# Patient Record
Sex: Female | Born: 1935 | Race: White | Hispanic: No
Health system: Southern US, Community
[De-identification: ages and names within clinical notes are randomized; demographics above are authoritative.]

## PROBLEM LIST (undated history)

## (undated) DIAGNOSIS — Z8679 Personal history of other diseases of the circulatory system: Secondary | ICD-10-CM

## (undated) DIAGNOSIS — M797 Fibromyalgia: Secondary | ICD-10-CM

## (undated) DIAGNOSIS — K589 Irritable bowel syndrome without diarrhea: Secondary | ICD-10-CM

## (undated) DIAGNOSIS — F419 Anxiety disorder, unspecified: Secondary | ICD-10-CM

## (undated) DIAGNOSIS — I10 Essential (primary) hypertension: Secondary | ICD-10-CM

## (undated) DIAGNOSIS — K859 Acute pancreatitis without necrosis or infection, unspecified: Secondary | ICD-10-CM

## (undated) DIAGNOSIS — E785 Hyperlipidemia, unspecified: Secondary | ICD-10-CM

## (undated) DIAGNOSIS — K635 Polyp of colon: Secondary | ICD-10-CM

## (undated) DIAGNOSIS — F0390 Unspecified dementia without behavioral disturbance: Secondary | ICD-10-CM

## (undated) DIAGNOSIS — D649 Anemia, unspecified: Secondary | ICD-10-CM

## (undated) DIAGNOSIS — E079 Disorder of thyroid, unspecified: Secondary | ICD-10-CM

## (undated) DIAGNOSIS — G459 Transient cerebral ischemic attack, unspecified: Secondary | ICD-10-CM

## (undated) HISTORY — DX: Acute pancreatitis without necrosis or infection, unspecified: K85.90

## (undated) HISTORY — DX: Disorder of thyroid, unspecified: E07.9

## (undated) HISTORY — DX: Transient cerebral ischemic attack, unspecified: G45.9

## (undated) HISTORY — DX: Anxiety disorder, unspecified: F41.9

## (undated) HISTORY — PX: POLYPECTOMY: SHX149

## (undated) HISTORY — DX: Personal history of other diseases of the circulatory system: Z86.79

## (undated) HISTORY — DX: Hyperlipidemia, unspecified: E78.5

## (undated) HISTORY — DX: Polyp of colon: K63.5

## (undated) HISTORY — DX: Anemia, unspecified: D64.9

## (undated) HISTORY — PX: CHOLECYSTECTOMY: SHX55

## (undated) HISTORY — DX: Unspecified dementia, unspecified severity, without behavioral disturbance, psychotic disturbance, mood disturbance, and anxiety: F03.90

## (undated) HISTORY — PX: BLADDER SURGERY: SHX569

## (undated) HISTORY — DX: Fibromyalgia: M79.7

## (undated) HISTORY — DX: Irritable bowel syndrome, unspecified: K58.9

---

## 1998-11-29 ENCOUNTER — Other Ambulatory Visit: Admission: RE | Admit: 1998-11-29 | Discharge: 1998-11-29 | Payer: Self-pay | Admitting: Gynecology

## 1999-10-28 ENCOUNTER — Encounter: Payer: Self-pay | Admitting: Family Medicine

## 1999-10-28 ENCOUNTER — Encounter: Admission: RE | Admit: 1999-10-28 | Discharge: 1999-10-28 | Payer: Self-pay | Admitting: Family Medicine

## 2000-11-03 ENCOUNTER — Encounter: Payer: Self-pay | Admitting: Gynecology

## 2000-11-03 ENCOUNTER — Encounter: Admission: RE | Admit: 2000-11-03 | Discharge: 2000-11-03 | Payer: Self-pay | Admitting: Gynecology

## 2000-12-10 ENCOUNTER — Other Ambulatory Visit: Admission: RE | Admit: 2000-12-10 | Discharge: 2000-12-10 | Payer: Self-pay | Admitting: Gynecology

## 2001-02-08 ENCOUNTER — Ambulatory Visit (HOSPITAL_COMMUNITY): Admission: RE | Admit: 2001-02-08 | Discharge: 2001-02-08 | Payer: Self-pay

## 2001-02-12 ENCOUNTER — Ambulatory Visit (HOSPITAL_COMMUNITY): Admission: RE | Admit: 2001-02-12 | Discharge: 2001-02-12 | Payer: Self-pay

## 2001-03-08 ENCOUNTER — Encounter (INDEPENDENT_AMBULATORY_CARE_PROVIDER_SITE_OTHER): Payer: Self-pay

## 2001-03-08 ENCOUNTER — Ambulatory Visit (HOSPITAL_COMMUNITY): Admission: RE | Admit: 2001-03-08 | Discharge: 2001-03-08 | Payer: Self-pay | Admitting: Urology

## 2001-03-08 ENCOUNTER — Encounter: Payer: Self-pay | Admitting: Urology

## 2001-06-14 ENCOUNTER — Encounter: Payer: Self-pay | Admitting: Urology

## 2001-06-14 ENCOUNTER — Ambulatory Visit (HOSPITAL_COMMUNITY): Admission: RE | Admit: 2001-06-14 | Discharge: 2001-06-14 | Payer: Self-pay | Admitting: Urology

## 2001-06-24 ENCOUNTER — Emergency Department (HOSPITAL_COMMUNITY): Admission: EM | Admit: 2001-06-24 | Discharge: 2001-06-25 | Payer: Self-pay

## 2001-11-05 ENCOUNTER — Encounter: Payer: Self-pay | Admitting: Internal Medicine

## 2001-11-05 ENCOUNTER — Encounter: Admission: RE | Admit: 2001-11-05 | Discharge: 2001-11-05 | Payer: Self-pay | Admitting: Internal Medicine

## 2001-11-17 ENCOUNTER — Encounter: Payer: Self-pay | Admitting: Ophthalmology

## 2001-11-17 ENCOUNTER — Ambulatory Visit (HOSPITAL_COMMUNITY): Admission: RE | Admit: 2001-11-17 | Discharge: 2001-11-17 | Payer: Self-pay | Admitting: Ophthalmology

## 2001-12-30 ENCOUNTER — Encounter (HOSPITAL_BASED_OUTPATIENT_CLINIC_OR_DEPARTMENT_OTHER): Admission: RE | Admit: 2001-12-30 | Discharge: 2002-03-30 | Payer: Self-pay | Admitting: Internal Medicine

## 2002-05-05 ENCOUNTER — Emergency Department (HOSPITAL_COMMUNITY): Admission: EM | Admit: 2002-05-05 | Discharge: 2002-05-05 | Payer: Self-pay | Admitting: Emergency Medicine

## 2002-05-06 ENCOUNTER — Encounter: Payer: Self-pay | Admitting: Emergency Medicine

## 2002-05-12 ENCOUNTER — Ambulatory Visit (HOSPITAL_COMMUNITY): Admission: RE | Admit: 2002-05-12 | Discharge: 2002-05-12 | Payer: Self-pay | Admitting: Internal Medicine

## 2002-05-12 ENCOUNTER — Encounter: Payer: Self-pay | Admitting: Internal Medicine

## 2002-05-17 ENCOUNTER — Encounter: Payer: Self-pay | Admitting: Internal Medicine

## 2002-05-17 ENCOUNTER — Ambulatory Visit (HOSPITAL_COMMUNITY): Admission: RE | Admit: 2002-05-17 | Discharge: 2002-05-17 | Payer: Self-pay | Admitting: Internal Medicine

## 2002-07-06 ENCOUNTER — Ambulatory Visit (HOSPITAL_COMMUNITY): Admission: RE | Admit: 2002-07-06 | Discharge: 2002-07-06 | Payer: Self-pay | Admitting: Gastroenterology

## 2002-07-06 ENCOUNTER — Encounter (INDEPENDENT_AMBULATORY_CARE_PROVIDER_SITE_OTHER): Payer: Self-pay | Admitting: Specialist

## 2003-07-02 ENCOUNTER — Emergency Department (HOSPITAL_COMMUNITY): Admission: EM | Admit: 2003-07-02 | Discharge: 2003-07-02 | Payer: Self-pay

## 2003-07-15 ENCOUNTER — Ambulatory Visit (HOSPITAL_COMMUNITY): Admission: RE | Admit: 2003-07-15 | Discharge: 2003-07-15 | Payer: Self-pay | Admitting: Internal Medicine

## 2003-10-17 ENCOUNTER — Encounter: Admission: RE | Admit: 2003-10-17 | Discharge: 2003-10-17 | Payer: Self-pay | Admitting: Internal Medicine

## 2004-01-03 ENCOUNTER — Encounter: Admission: RE | Admit: 2004-01-03 | Discharge: 2004-01-03 | Payer: Self-pay | Admitting: Internal Medicine

## 2004-01-11 ENCOUNTER — Emergency Department (HOSPITAL_COMMUNITY): Admission: EM | Admit: 2004-01-11 | Discharge: 2004-01-11 | Payer: Self-pay | Admitting: Emergency Medicine

## 2004-01-17 ENCOUNTER — Encounter: Admission: RE | Admit: 2004-01-17 | Discharge: 2004-01-17 | Payer: Self-pay | Admitting: Internal Medicine

## 2004-01-19 ENCOUNTER — Emergency Department (HOSPITAL_COMMUNITY): Admission: EM | Admit: 2004-01-19 | Discharge: 2004-01-20 | Payer: Self-pay | Admitting: Emergency Medicine

## 2004-01-22 ENCOUNTER — Emergency Department (HOSPITAL_COMMUNITY): Admission: EM | Admit: 2004-01-22 | Discharge: 2004-01-23 | Payer: Self-pay | Admitting: Emergency Medicine

## 2004-01-24 ENCOUNTER — Inpatient Hospital Stay (HOSPITAL_COMMUNITY): Admission: AD | Admit: 2004-01-24 | Discharge: 2004-01-26 | Payer: Self-pay | Admitting: Internal Medicine

## 2004-06-10 ENCOUNTER — Ambulatory Visit (HOSPITAL_COMMUNITY): Admission: RE | Admit: 2004-06-10 | Discharge: 2004-06-10 | Payer: Self-pay | Admitting: Internal Medicine

## 2004-07-23 ENCOUNTER — Encounter: Admission: RE | Admit: 2004-07-23 | Discharge: 2004-07-23 | Payer: Self-pay | Admitting: Internal Medicine

## 2004-11-06 ENCOUNTER — Ambulatory Visit (HOSPITAL_COMMUNITY): Admission: RE | Admit: 2004-11-06 | Discharge: 2004-11-06 | Payer: Self-pay

## 2005-01-08 ENCOUNTER — Ambulatory Visit (HOSPITAL_COMMUNITY): Admission: RE | Admit: 2005-01-08 | Discharge: 2005-01-08 | Payer: Self-pay

## 2005-03-19 ENCOUNTER — Encounter: Admission: RE | Admit: 2005-03-19 | Discharge: 2005-03-19 | Payer: Self-pay | Admitting: Internal Medicine

## 2005-06-29 ENCOUNTER — Inpatient Hospital Stay (HOSPITAL_COMMUNITY): Admission: EM | Admit: 2005-06-29 | Discharge: 2005-07-06 | Payer: Self-pay | Admitting: Emergency Medicine

## 2005-07-25 ENCOUNTER — Inpatient Hospital Stay (HOSPITAL_COMMUNITY): Admission: EM | Admit: 2005-07-25 | Discharge: 2005-07-28 | Payer: Self-pay | Admitting: Emergency Medicine

## 2005-08-02 ENCOUNTER — Encounter: Admission: RE | Admit: 2005-08-02 | Discharge: 2005-08-02 | Payer: Self-pay | Admitting: Internal Medicine

## 2005-09-17 ENCOUNTER — Encounter: Payer: Self-pay | Admitting: Cardiology

## 2005-10-07 ENCOUNTER — Ambulatory Visit (HOSPITAL_COMMUNITY): Admission: RE | Admit: 2005-10-07 | Discharge: 2005-10-07 | Payer: Self-pay | Admitting: Gastroenterology

## 2005-10-07 ENCOUNTER — Encounter (INDEPENDENT_AMBULATORY_CARE_PROVIDER_SITE_OTHER): Payer: Self-pay | Admitting: Specialist

## 2005-10-19 ENCOUNTER — Observation Stay (HOSPITAL_COMMUNITY): Admission: EM | Admit: 2005-10-19 | Discharge: 2005-10-21 | Payer: Self-pay | Admitting: Emergency Medicine

## 2005-10-20 ENCOUNTER — Encounter: Payer: Self-pay | Admitting: Cardiology

## 2005-10-29 ENCOUNTER — Ambulatory Visit (HOSPITAL_COMMUNITY): Admission: RE | Admit: 2005-10-29 | Discharge: 2005-10-29 | Payer: Self-pay | Admitting: Gastroenterology

## 2006-01-15 ENCOUNTER — Ambulatory Visit: Payer: Self-pay | Admitting: Oncology

## 2006-01-27 LAB — CBC WITH DIFFERENTIAL/PLATELET
Eosinophils Absolute: 0.2 10*3/uL (ref 0.0–0.5)
MONO#: 0.7 10*3/uL (ref 0.1–0.9)
NEUT#: 12.1 10*3/uL — ABNORMAL HIGH (ref 1.5–6.5)
RBC: 3.75 10*6/uL (ref 3.70–5.32)
RDW: 16.1 % — ABNORMAL HIGH (ref 11.3–14.5)
WBC: 14.6 10*3/uL — ABNORMAL HIGH (ref 3.9–10.0)
lymph#: 1.6 10*3/uL (ref 0.9–3.3)

## 2006-01-27 LAB — CHCC SMEAR

## 2006-01-29 LAB — COMPREHENSIVE METABOLIC PANEL
AST: 19 U/L (ref 0–37)
Alkaline Phosphatase: 59 U/L (ref 39–117)
Glucose, Bld: 190 mg/dL — ABNORMAL HIGH (ref 70–99)
Sodium: 136 mEq/L (ref 135–145)
Total Bilirubin: 0.5 mg/dL (ref 0.3–1.2)
Total Protein: 8.1 g/dL (ref 6.0–8.3)

## 2006-01-29 LAB — IRON AND TIBC
%SAT: 12 % — ABNORMAL LOW (ref 20–55)
Iron: 40 ug/dL — ABNORMAL LOW (ref 42–145)

## 2006-01-29 LAB — IMMUNOFIXATION ELECTROPHORESIS
IgA: 388 mg/dL — ABNORMAL HIGH (ref 68–378)
Total Protein, Serum Electrophoresis: 8.1 g/dL (ref 6.0–8.3)

## 2006-01-29 LAB — FERRITIN: Ferritin: 25 ng/mL (ref 10–291)

## 2006-01-29 LAB — ERYTHROPOIETIN: Erythropoietin: 20.3 m[IU]/mL (ref 2.6–34.0)

## 2006-03-02 ENCOUNTER — Ambulatory Visit: Payer: Self-pay | Admitting: Oncology

## 2006-03-05 LAB — CBC WITH DIFFERENTIAL/PLATELET
Basophils Absolute: 0 10*3/uL (ref 0.0–0.1)
Eosinophils Absolute: 0.2 10*3/uL (ref 0.0–0.5)
HGB: 11 g/dL — ABNORMAL LOW (ref 11.6–15.9)
MCV: 85.9 fL (ref 81.0–101.0)
MONO#: 0.6 10*3/uL (ref 0.1–0.9)
MONO%: 5.2 % (ref 0.0–13.0)
NEUT#: 9 10*3/uL — ABNORMAL HIGH (ref 1.5–6.5)
RDW: 16 % — ABNORMAL HIGH (ref 11.3–14.5)
WBC: 11.5 10*3/uL — ABNORMAL HIGH (ref 3.9–10.0)
lymph#: 1.7 10*3/uL (ref 0.9–3.3)

## 2006-03-05 LAB — TSH: TSH: 0.505 u[IU]/mL (ref 0.350–5.500)

## 2006-03-05 LAB — T3: T3, Total: 116 ng/dL (ref 60.0–181.0)

## 2006-03-05 LAB — T3 UPTAKE: T3 Uptake: 37.5 % — ABNORMAL HIGH (ref 22.5–37.0)

## 2006-03-26 LAB — COMPREHENSIVE METABOLIC PANEL
ALT: 10 U/L (ref 0–40)
AST: 14 U/L (ref 0–37)
Albumin: 4.4 g/dL (ref 3.5–5.2)
Calcium: 9.9 mg/dL (ref 8.4–10.5)
Chloride: 99 mEq/L (ref 96–112)
Potassium: 4.6 mEq/L (ref 3.5–5.3)
Total Protein: 8.4 g/dL — ABNORMAL HIGH (ref 6.0–8.3)

## 2006-03-26 LAB — CBC WITH DIFFERENTIAL/PLATELET
BASO%: 0.5 % (ref 0.0–2.0)
Basophils Absolute: 0.1 10*3/uL (ref 0.0–0.1)
EOS%: 1.4 % (ref 0.0–7.0)
HGB: 11.6 g/dL (ref 11.6–15.9)
MCH: 28 pg (ref 26.0–34.0)
MCHC: 32.9 g/dL (ref 32.0–36.0)
RDW: 16.3 % — ABNORMAL HIGH (ref 11.3–14.5)
lymph#: 1.6 10*3/uL (ref 0.9–3.3)

## 2006-04-16 ENCOUNTER — Ambulatory Visit: Payer: Self-pay | Admitting: Oncology

## 2006-04-16 LAB — CBC WITH DIFFERENTIAL/PLATELET
BASO%: 0.6 % (ref 0.0–2.0)
EOS%: 2.3 % (ref 0.0–7.0)
MCH: 28.7 pg (ref 26.0–34.0)
MCHC: 33.3 g/dL (ref 32.0–36.0)
RDW: 17.5 % — ABNORMAL HIGH (ref 11.3–14.5)
lymph#: 2.4 10*3/uL (ref 0.9–3.3)

## 2006-05-07 LAB — CBC WITH DIFFERENTIAL/PLATELET
Basophils Absolute: 0.1 10*3/uL (ref 0.0–0.1)
EOS%: 1.8 % (ref 0.0–7.0)
Eosinophils Absolute: 0.2 10*3/uL (ref 0.0–0.5)
HGB: 10.7 g/dL — ABNORMAL LOW (ref 11.6–15.9)
MCH: 29.1 pg (ref 26.0–34.0)
NEUT#: 8 10*3/uL — ABNORMAL HIGH (ref 1.5–6.5)
RBC: 3.69 10*6/uL — ABNORMAL LOW (ref 3.70–5.32)
RDW: 18.1 % — ABNORMAL HIGH (ref 11.3–14.5)
lymph#: 1.6 10*3/uL (ref 0.9–3.3)

## 2006-05-14 ENCOUNTER — Encounter: Admission: RE | Admit: 2006-05-14 | Discharge: 2006-05-14 | Payer: Self-pay | Admitting: Internal Medicine

## 2006-05-20 ENCOUNTER — Inpatient Hospital Stay (HOSPITAL_COMMUNITY): Admission: EM | Admit: 2006-05-20 | Discharge: 2006-05-22 | Payer: Self-pay | Admitting: Emergency Medicine

## 2006-05-26 ENCOUNTER — Ambulatory Visit: Payer: Self-pay | Admitting: Oncology

## 2006-06-30 LAB — CBC WITH DIFFERENTIAL/PLATELET
BASO%: 0.7 % (ref 0.0–2.0)
LYMPH%: 8.4 % — ABNORMAL LOW (ref 14.0–48.0)
MCHC: 33.5 g/dL (ref 32.0–36.0)
MONO#: 1.1 10*3/uL — ABNORMAL HIGH (ref 0.1–0.9)
MONO%: 6.2 % (ref 0.0–13.0)
Platelets: 432 10*3/uL — ABNORMAL HIGH (ref 145–400)
RBC: 3.35 10*6/uL — ABNORMAL LOW (ref 3.70–5.32)
WBC: 17 10*3/uL — ABNORMAL HIGH (ref 3.9–10.0)

## 2006-07-22 ENCOUNTER — Ambulatory Visit: Payer: Self-pay | Admitting: Oncology

## 2006-07-25 ENCOUNTER — Inpatient Hospital Stay (HOSPITAL_COMMUNITY): Admission: EM | Admit: 2006-07-25 | Discharge: 2006-07-31 | Payer: Self-pay | Admitting: Emergency Medicine

## 2006-07-27 ENCOUNTER — Encounter (INDEPENDENT_AMBULATORY_CARE_PROVIDER_SITE_OTHER): Payer: Self-pay | Admitting: *Deleted

## 2006-09-01 ENCOUNTER — Ambulatory Visit: Payer: Self-pay | Admitting: Oncology

## 2006-09-04 ENCOUNTER — Ambulatory Visit: Payer: Self-pay | Admitting: Oncology

## 2006-09-04 LAB — CBC WITH DIFFERENTIAL/PLATELET
Basophils Absolute: 0.1 10*3/uL (ref 0.0–0.1)
HCT: 29.5 % — ABNORMAL LOW (ref 34.8–46.6)
HGB: 9.8 g/dL — ABNORMAL LOW (ref 11.6–15.9)
MCH: 30.3 pg (ref 26.0–34.0)
MONO#: 0.9 10*3/uL (ref 0.1–0.9)
NEUT%: 65.8 % (ref 39.6–76.8)
WBC: 12.9 10*3/uL — ABNORMAL HIGH (ref 3.9–10.0)
lymph#: 2.3 10*3/uL (ref 0.9–3.3)

## 2006-09-22 LAB — CBC WITH DIFFERENTIAL/PLATELET
Basophils Absolute: 0 10*3/uL (ref 0.0–0.1)
EOS%: 8.4 % — ABNORMAL HIGH (ref 0.0–7.0)
HCT: 31.4 % — ABNORMAL LOW (ref 34.8–46.6)
HGB: 10.9 g/dL — ABNORMAL LOW (ref 11.6–15.9)
MCH: 32 pg (ref 26.0–34.0)
MCV: 92.6 fL (ref 81.0–101.0)
MONO%: 9.4 % (ref 0.0–13.0)
NEUT%: 59 % (ref 39.6–76.8)

## 2006-10-13 LAB — CBC WITH DIFFERENTIAL/PLATELET
Basophils Absolute: 0 10*3/uL (ref 0.0–0.1)
EOS%: 2.6 % (ref 0.0–7.0)
MCH: 30.9 pg (ref 26.0–34.0)
MCV: 91.1 fL (ref 81.0–101.0)
MONO%: 6.1 % (ref 0.0–13.0)
RBC: 4.1 10*6/uL (ref 3.70–5.32)
RDW: 19 % — ABNORMAL HIGH (ref 11.3–14.5)

## 2006-10-29 ENCOUNTER — Ambulatory Visit: Payer: Self-pay | Admitting: Oncology

## 2006-11-03 LAB — CBC WITH DIFFERENTIAL/PLATELET
BASO%: 0.2 % (ref 0.0–2.0)
EOS%: 2.9 % (ref 0.0–7.0)
Eosinophils Absolute: 0.5 10*3/uL (ref 0.0–0.5)
MCHC: 34.7 g/dL (ref 32.0–36.0)
MCV: 90.9 fL (ref 81.0–101.0)
MONO%: 5.7 % (ref 0.0–13.0)
NEUT#: 13.2 10*3/uL — ABNORMAL HIGH (ref 1.5–6.5)
RBC: 3.51 10*6/uL — ABNORMAL LOW (ref 3.70–5.32)
RDW: 17.1 % — ABNORMAL HIGH (ref 11.3–14.5)

## 2006-11-24 LAB — CBC WITH DIFFERENTIAL/PLATELET
BASO%: 0.7 % (ref 0.0–2.0)
Basophils Absolute: 0.1 10*3/uL (ref 0.0–0.1)
EOS%: 7.4 % — ABNORMAL HIGH (ref 0.0–7.0)
HCT: 33.2 % — ABNORMAL LOW (ref 34.8–46.6)
HGB: 11.4 g/dL — ABNORMAL LOW (ref 11.6–15.9)
LYMPH%: 21.7 % (ref 14.0–48.0)
MCH: 31.6 pg (ref 26.0–34.0)
MCHC: 34.4 g/dL (ref 32.0–36.0)
NEUT%: 59.2 % (ref 39.6–76.8)
Platelets: 334 10*3/uL (ref 145–400)

## 2006-12-15 ENCOUNTER — Ambulatory Visit: Payer: Self-pay | Admitting: Oncology

## 2006-12-15 LAB — CBC WITH DIFFERENTIAL/PLATELET
BASO%: 0.5 % (ref 0.0–2.0)
Basophils Absolute: 0 10*3/uL (ref 0.0–0.1)
EOS%: 7.6 % — ABNORMAL HIGH (ref 0.0–7.0)
HCT: 33 % — ABNORMAL LOW (ref 34.8–46.6)
HGB: 11.3 g/dL — ABNORMAL LOW (ref 11.6–15.9)
MCH: 31.4 pg (ref 26.0–34.0)
MCHC: 34.1 g/dL (ref 32.0–36.0)
MCV: 92.3 fL (ref 81.0–101.0)
MONO%: 12.1 % (ref 0.0–13.0)
NEUT%: 51.7 % (ref 39.6–76.8)
lymph#: 2.2 10*3/uL (ref 0.9–3.3)

## 2006-12-16 ENCOUNTER — Ambulatory Visit (HOSPITAL_COMMUNITY): Admission: RE | Admit: 2006-12-16 | Discharge: 2006-12-16 | Payer: Self-pay | Admitting: Internal Medicine

## 2007-01-06 LAB — CBC WITH DIFFERENTIAL/PLATELET
BASO%: 0.9 % (ref 0.0–2.0)
Basophils Absolute: 0.1 10*3/uL (ref 0.0–0.1)
EOS%: 9.3 % — ABNORMAL HIGH (ref 0.0–7.0)
HGB: 11.6 g/dL (ref 11.6–15.9)
MCH: 31.3 pg (ref 26.0–34.0)
MCHC: 33.8 g/dL (ref 32.0–36.0)
MCV: 92.7 fL (ref 81.0–101.0)
MONO%: 8.7 % (ref 0.0–13.0)
RBC: 3.72 10*6/uL (ref 3.70–5.32)
RDW: 16.6 % — ABNORMAL HIGH (ref 11.3–14.5)
lymph#: 2.4 10*3/uL (ref 0.9–3.3)

## 2007-01-26 LAB — CBC WITH DIFFERENTIAL/PLATELET
Basophils Absolute: 0.1 10*3/uL (ref 0.0–0.1)
Eosinophils Absolute: 0.3 10*3/uL (ref 0.0–0.5)
HGB: 12 g/dL (ref 11.6–15.9)
MCV: 91.2 fL (ref 81.0–101.0)
MONO#: 0.8 10*3/uL (ref 0.1–0.9)
MONO%: 8.9 % (ref 0.0–13.0)
NEUT#: 6.7 10*3/uL — ABNORMAL HIGH (ref 1.5–6.5)
RBC: 3.86 10*6/uL (ref 3.70–5.32)
RDW: 16 % — ABNORMAL HIGH (ref 11.3–14.5)
WBC: 9.6 10*3/uL (ref 3.9–10.0)
lymph#: 1.7 10*3/uL (ref 0.9–3.3)

## 2007-02-11 ENCOUNTER — Ambulatory Visit: Payer: Self-pay | Admitting: Oncology

## 2007-02-16 LAB — CBC WITH DIFFERENTIAL/PLATELET
Basophils Absolute: 0 10*3/uL (ref 0.0–0.1)
Eosinophils Absolute: 0.4 10*3/uL (ref 0.0–0.5)
HGB: 13.3 g/dL (ref 11.6–15.9)
LYMPH%: 9.9 % — ABNORMAL LOW (ref 14.0–48.0)
MCV: 91 fL (ref 81.0–101.0)
MONO#: 0.8 10*3/uL (ref 0.1–0.9)
NEUT#: 11.1 10*3/uL — ABNORMAL HIGH (ref 1.5–6.5)
Platelets: 246 10*3/uL (ref 145–400)
RBC: 4.28 10*6/uL (ref 3.70–5.32)
WBC: 13.6 10*3/uL — ABNORMAL HIGH (ref 3.9–10.0)

## 2007-03-09 LAB — CBC WITH DIFFERENTIAL/PLATELET
Eosinophils Absolute: 0.6 10*3/uL — ABNORMAL HIGH (ref 0.0–0.5)
HCT: 29.4 % — ABNORMAL LOW (ref 34.8–46.6)
LYMPH%: 11.1 % — ABNORMAL LOW (ref 14.0–48.0)
MCV: 90.9 fL (ref 81.0–101.0)
MONO#: 1 10*3/uL — ABNORMAL HIGH (ref 0.1–0.9)
MONO%: 6.6 % (ref 0.0–13.0)
NEUT#: 12.6 10*3/uL — ABNORMAL HIGH (ref 1.5–6.5)
NEUT%: 78.5 % — ABNORMAL HIGH (ref 39.6–76.8)
Platelets: 282 10*3/uL (ref 145–400)
WBC: 16 10*3/uL — ABNORMAL HIGH (ref 3.9–10.0)

## 2007-03-12 ENCOUNTER — Inpatient Hospital Stay (HOSPITAL_COMMUNITY): Admission: AD | Admit: 2007-03-12 | Discharge: 2007-03-16 | Payer: Self-pay | Admitting: Internal Medicine

## 2007-03-26 ENCOUNTER — Ambulatory Visit: Payer: Self-pay | Admitting: Oncology

## 2007-03-30 LAB — CBC WITH DIFFERENTIAL/PLATELET
Eosinophils Absolute: 0.4 10*3/uL (ref 0.0–0.5)
LYMPH%: 12 % — ABNORMAL LOW (ref 14.0–48.0)
MCV: 91.7 fL (ref 81.0–101.0)
MONO%: 6.4 % (ref 0.0–13.0)
NEUT#: 7.8 10*3/uL — ABNORMAL HIGH (ref 1.5–6.5)
Platelets: 241 10*3/uL (ref 145–400)
RBC: 3.53 10*6/uL — ABNORMAL LOW (ref 3.70–5.32)

## 2007-04-06 ENCOUNTER — Observation Stay (HOSPITAL_COMMUNITY): Admission: AD | Admit: 2007-04-06 | Discharge: 2007-04-10 | Payer: Self-pay | Admitting: Internal Medicine

## 2007-04-19 ENCOUNTER — Inpatient Hospital Stay (HOSPITAL_COMMUNITY): Admission: EM | Admit: 2007-04-19 | Discharge: 2007-04-23 | Payer: Self-pay | Admitting: Emergency Medicine

## 2007-04-20 ENCOUNTER — Ambulatory Visit: Payer: Self-pay | Admitting: Vascular Surgery

## 2007-04-20 ENCOUNTER — Encounter (INDEPENDENT_AMBULATORY_CARE_PROVIDER_SITE_OTHER): Payer: Self-pay | Admitting: Emergency Medicine

## 2007-04-20 ENCOUNTER — Encounter: Payer: Self-pay | Admitting: Cardiology

## 2007-05-07 ENCOUNTER — Ambulatory Visit: Payer: Self-pay | Admitting: Oncology

## 2007-05-11 LAB — CBC WITH DIFFERENTIAL/PLATELET
BASO%: 0 % (ref 0.0–2.0)
EOS%: 1.6 % (ref 0.0–7.0)
LYMPH%: 12.4 % — ABNORMAL LOW (ref 14.0–48.0)
MCH: 32.9 pg (ref 26.0–34.0)
MCHC: 35.3 g/dL (ref 32.0–36.0)
MCV: 93.3 fL (ref 81.0–101.0)
MONO#: 1 10*3/uL — ABNORMAL HIGH (ref 0.1–0.9)
MONO%: 6.2 % (ref 0.0–13.0)
Platelets: 201 10*3/uL (ref 145–400)
RBC: 3.73 10*6/uL (ref 3.70–5.32)
WBC: 15.4 10*3/uL — ABNORMAL HIGH (ref 3.9–10.0)

## 2007-06-04 LAB — CBC WITH DIFFERENTIAL/PLATELET
BASO%: 0.2 % (ref 0.0–2.0)
EOS%: 1.3 % (ref 0.0–7.0)
MCHC: 34.9 g/dL (ref 32.0–36.0)
MONO#: 1 10*3/uL — ABNORMAL HIGH (ref 0.1–0.9)
RBC: 3.87 10*6/uL (ref 3.70–5.32)
WBC: 16.1 10*3/uL — ABNORMAL HIGH (ref 3.9–10.0)
lymph#: 2.5 10*3/uL (ref 0.9–3.3)

## 2007-07-01 ENCOUNTER — Ambulatory Visit: Payer: Self-pay | Admitting: Oncology

## 2007-07-05 LAB — CBC WITH DIFFERENTIAL/PLATELET
BASO%: 0.5 % (ref 0.0–2.0)
Basophils Absolute: 0.1 10*3/uL (ref 0.0–0.1)
HCT: 32.5 % — ABNORMAL LOW (ref 34.8–46.6)
HGB: 11.5 g/dL — ABNORMAL LOW (ref 11.6–15.9)
MCHC: 35.2 g/dL (ref 32.0–36.0)
MONO#: 1.1 10*3/uL — ABNORMAL HIGH (ref 0.1–0.9)
NEUT%: 59 % (ref 39.6–76.8)
RDW: 15.9 % — ABNORMAL HIGH (ref 11.3–14.5)
WBC: 13.2 10*3/uL — ABNORMAL HIGH (ref 3.9–10.0)
lymph#: 3.8 10*3/uL — ABNORMAL HIGH (ref 0.9–3.3)

## 2007-07-28 ENCOUNTER — Encounter: Admission: RE | Admit: 2007-07-28 | Discharge: 2007-07-28 | Payer: Self-pay | Admitting: Internal Medicine

## 2007-08-02 LAB — CBC WITH DIFFERENTIAL/PLATELET
Basophils Absolute: 0.1 10*3/uL (ref 0.0–0.1)
EOS%: 1.8 % (ref 0.0–7.0)
Eosinophils Absolute: 0.2 10*3/uL (ref 0.0–0.5)
HCT: 32.9 % — ABNORMAL LOW (ref 34.8–46.6)
HGB: 11.3 g/dL — ABNORMAL LOW (ref 11.6–15.9)
MCH: 32.7 pg (ref 26.0–34.0)
NEUT#: 9.5 10*3/uL — ABNORMAL HIGH (ref 1.5–6.5)
NEUT%: 67.3 % (ref 39.6–76.8)
RDW: 15.5 % — ABNORMAL HIGH (ref 11.3–14.5)
lymph#: 3.1 10*3/uL (ref 0.9–3.3)

## 2007-08-25 ENCOUNTER — Ambulatory Visit: Payer: Self-pay | Admitting: Oncology

## 2007-08-30 LAB — CBC WITH DIFFERENTIAL/PLATELET
Eosinophils Absolute: 0.2 10*3/uL (ref 0.0–0.5)
LYMPH%: 19.4 % (ref 14.0–48.0)
MCV: 93.8 fL (ref 81.0–101.0)
MONO%: 8 % (ref 0.0–13.0)
NEUT#: 8.8 10*3/uL — ABNORMAL HIGH (ref 1.5–6.5)
NEUT%: 70.4 % (ref 39.6–76.8)
Platelets: 219 10*3/uL (ref 145–400)
RBC: 3.59 10*6/uL — ABNORMAL LOW (ref 3.70–5.32)

## 2007-09-20 ENCOUNTER — Encounter: Payer: Self-pay | Admitting: Endocrinology

## 2007-09-22 ENCOUNTER — Encounter: Payer: Self-pay | Admitting: Cardiology

## 2007-09-28 LAB — CBC WITH DIFFERENTIAL/PLATELET
EOS%: 2.2 % (ref 0.0–7.0)
Eosinophils Absolute: 0.3 10*3/uL (ref 0.0–0.5)
LYMPH%: 20 % (ref 14.0–48.0)
MCH: 31.7 pg (ref 26.0–34.0)
MCHC: 34.9 g/dL (ref 32.0–36.0)
MCV: 90.7 fL (ref 81.0–101.0)
MONO%: 8.8 % (ref 0.0–13.0)
NEUT#: 10.9 10*3/uL — ABNORMAL HIGH (ref 1.5–6.5)
Platelets: 236 10*3/uL (ref 145–400)
RBC: 3.62 10*6/uL — ABNORMAL LOW (ref 3.70–5.32)

## 2007-09-29 ENCOUNTER — Ambulatory Visit: Payer: Self-pay | Admitting: Endocrinology

## 2007-09-29 DIAGNOSIS — R0602 Shortness of breath: Secondary | ICD-10-CM | POA: Insufficient documentation

## 2007-09-29 DIAGNOSIS — K5090 Crohn's disease, unspecified, without complications: Secondary | ICD-10-CM | POA: Insufficient documentation

## 2007-09-29 DIAGNOSIS — Z8679 Personal history of other diseases of the circulatory system: Secondary | ICD-10-CM | POA: Insufficient documentation

## 2007-09-29 DIAGNOSIS — E1165 Type 2 diabetes mellitus with hyperglycemia: Secondary | ICD-10-CM

## 2007-09-29 DIAGNOSIS — R51 Headache: Secondary | ICD-10-CM

## 2007-09-29 DIAGNOSIS — R519 Headache, unspecified: Secondary | ICD-10-CM | POA: Insufficient documentation

## 2007-10-04 ENCOUNTER — Telehealth (INDEPENDENT_AMBULATORY_CARE_PROVIDER_SITE_OTHER): Payer: Self-pay | Admitting: *Deleted

## 2007-10-06 ENCOUNTER — Ambulatory Visit: Payer: Self-pay | Admitting: Endocrinology

## 2007-10-21 ENCOUNTER — Ambulatory Visit: Payer: Self-pay | Admitting: Oncology

## 2007-10-25 LAB — CBC WITH DIFFERENTIAL/PLATELET
Basophils Absolute: 0 10*3/uL (ref 0.0–0.1)
Eosinophils Absolute: 0.2 10*3/uL (ref 0.0–0.5)
HCT: 33.4 % — ABNORMAL LOW (ref 34.8–46.6)
HGB: 11.6 g/dL (ref 11.6–15.9)
LYMPH%: 23.4 % (ref 14.0–48.0)
MCH: 32 pg (ref 26.0–34.0)
MCV: 92 fL (ref 81.0–101.0)
MONO%: 7.7 % (ref 0.0–13.0)
NEUT#: 9.7 10*3/uL — ABNORMAL HIGH (ref 1.5–6.5)
NEUT%: 66.9 % (ref 39.6–76.8)
Platelets: 218 10*3/uL (ref 145–400)

## 2007-11-22 LAB — CBC WITH DIFFERENTIAL/PLATELET
Basophils Absolute: 0.1 10*3/uL (ref 0.0–0.1)
Eosinophils Absolute: 0.2 10*3/uL (ref 0.0–0.5)
HCT: 33.3 % — ABNORMAL LOW (ref 34.8–46.6)
HGB: 11.6 g/dL (ref 11.6–15.9)
MCV: 92.5 fL (ref 81.0–101.0)
NEUT#: 14.9 10*3/uL — ABNORMAL HIGH (ref 1.5–6.5)
NEUT%: 86.2 % — ABNORMAL HIGH (ref 39.6–76.8)
RDW: 17.7 % — ABNORMAL HIGH (ref 11.3–14.5)
lymph#: 1.5 10*3/uL (ref 0.9–3.3)

## 2007-12-16 ENCOUNTER — Ambulatory Visit: Payer: Self-pay | Admitting: Oncology

## 2007-12-20 LAB — CBC WITH DIFFERENTIAL/PLATELET
BASO%: 0.4 % (ref 0.0–2.0)
Basophils Absolute: 0.1 10*3/uL (ref 0.0–0.1)
EOS%: 1.3 % (ref 0.0–7.0)
HCT: 33.3 % — ABNORMAL LOW (ref 34.8–46.6)
HGB: 11.5 g/dL — ABNORMAL LOW (ref 11.6–15.9)
MCH: 31.9 pg (ref 26.0–34.0)
MONO#: 1.6 10*3/uL — ABNORMAL HIGH (ref 0.1–0.9)
NEUT%: 69.3 % (ref 39.6–76.8)
RDW: 17.3 % — ABNORMAL HIGH (ref 11.3–14.5)
WBC: 15.9 10*3/uL — ABNORMAL HIGH (ref 3.9–10.0)
lymph#: 3 10*3/uL (ref 0.9–3.3)

## 2008-01-18 LAB — CBC WITH DIFFERENTIAL/PLATELET
Eosinophils Absolute: 0.2 10*3/uL (ref 0.0–0.5)
LYMPH%: 16.1 % (ref 14.0–48.0)
MONO#: 0.9 10*3/uL (ref 0.1–0.9)
NEUT#: 8.4 10*3/uL — ABNORMAL HIGH (ref 1.5–6.5)
Platelets: 259 10*3/uL (ref 145–400)
RBC: 3.3 10*6/uL — ABNORMAL LOW (ref 3.70–5.32)
WBC: 11.4 10*3/uL — ABNORMAL HIGH (ref 3.9–10.0)

## 2008-02-09 ENCOUNTER — Ambulatory Visit: Payer: Self-pay | Admitting: Oncology

## 2008-02-14 LAB — CBC WITH DIFFERENTIAL/PLATELET
Basophils Absolute: 0 10*3/uL (ref 0.0–0.1)
EOS%: 0.5 % (ref 0.0–7.0)
HCT: 34.2 % — ABNORMAL LOW (ref 34.8–46.6)
HGB: 11.8 g/dL (ref 11.6–15.9)
LYMPH%: 10.9 % — ABNORMAL LOW (ref 14.0–48.0)
MCH: 31.6 pg (ref 26.0–34.0)
MCV: 92.1 fL (ref 81.0–101.0)
MONO%: 5.6 % (ref 0.0–13.0)
NEUT%: 82.7 % — ABNORMAL HIGH (ref 39.6–76.8)
Platelets: 250 10*3/uL (ref 145–400)
lymph#: 1.7 10*3/uL (ref 0.9–3.3)

## 2008-02-14 LAB — IRON AND TIBC: TIBC: 277 ug/dL (ref 250–470)

## 2008-04-13 ENCOUNTER — Emergency Department (HOSPITAL_COMMUNITY): Admission: EM | Admit: 2008-04-13 | Discharge: 2008-04-14 | Payer: Self-pay | Admitting: Emergency Medicine

## 2008-04-19 ENCOUNTER — Ambulatory Visit: Payer: Self-pay | Admitting: Oncology

## 2008-04-24 LAB — CBC WITH DIFFERENTIAL/PLATELET
Basophils Absolute: 0 10*3/uL (ref 0.0–0.1)
Eosinophils Absolute: 0.2 10*3/uL (ref 0.0–0.5)
HCT: 32.3 % — ABNORMAL LOW (ref 34.8–46.6)
HGB: 11.2 g/dL — ABNORMAL LOW (ref 11.6–15.9)
MONO#: 1 10*3/uL — ABNORMAL HIGH (ref 0.1–0.9)
NEUT%: 71 % (ref 39.6–76.8)
Platelets: 186 10*3/uL (ref 145–400)
WBC: 10.6 10*3/uL — ABNORMAL HIGH (ref 3.9–10.0)
lymph#: 1.9 10*3/uL (ref 0.9–3.3)

## 2008-05-12 ENCOUNTER — Emergency Department (HOSPITAL_COMMUNITY): Admission: EM | Admit: 2008-05-12 | Discharge: 2008-05-12 | Payer: Self-pay | Admitting: Emergency Medicine

## 2008-05-16 ENCOUNTER — Ambulatory Visit (HOSPITAL_COMMUNITY): Admission: RE | Admit: 2008-05-16 | Discharge: 2008-05-16 | Payer: Self-pay | Admitting: Gastroenterology

## 2008-05-22 ENCOUNTER — Ambulatory Visit (HOSPITAL_COMMUNITY): Admission: RE | Admit: 2008-05-22 | Discharge: 2008-05-22 | Payer: Self-pay | Admitting: Gastroenterology

## 2008-05-23 LAB — CBC WITH DIFFERENTIAL/PLATELET
Basophils Absolute: 0 10*3/uL (ref 0.0–0.1)
EOS%: 0.7 % (ref 0.0–7.0)
HCT: 35.2 % (ref 34.8–46.6)
HGB: 12.2 g/dL (ref 11.6–15.9)
LYMPH%: 12 % — ABNORMAL LOW (ref 14.0–48.0)
MCH: 31.6 pg (ref 26.0–34.0)
MCHC: 34.6 g/dL (ref 32.0–36.0)
MONO#: 0.6 10*3/uL (ref 0.1–0.9)
NEUT%: 81 % — ABNORMAL HIGH (ref 39.6–76.8)
Platelets: 205 10*3/uL (ref 145–400)
lymph#: 1.1 10*3/uL (ref 0.9–3.3)

## 2008-05-23 LAB — COMPREHENSIVE METABOLIC PANEL
BUN: 11 mg/dL (ref 6–23)
CO2: 24 mEq/L (ref 19–32)
Calcium: 10.6 mg/dL — ABNORMAL HIGH (ref 8.4–10.5)
Chloride: 98 mEq/L (ref 96–112)
Creatinine, Ser: 0.9 mg/dL (ref 0.40–1.20)
Total Bilirubin: 0.6 mg/dL (ref 0.3–1.2)

## 2008-05-25 ENCOUNTER — Ambulatory Visit (HOSPITAL_COMMUNITY): Admission: RE | Admit: 2008-05-25 | Discharge: 2008-05-25 | Payer: Self-pay | Admitting: Gastroenterology

## 2008-05-25 LAB — SPEP & IFE WITH QIG
Albumin ELP: 54.7 % — ABNORMAL LOW (ref 55.8–66.1)
Alpha-1-Globulin: 4.1 % (ref 2.9–4.9)
Alpha-2-Globulin: 10.5 % (ref 7.1–11.8)
Beta 2: 4.8 % (ref 3.2–6.5)
Beta Globulin: 5.6 % (ref 4.7–7.2)
Gamma Globulin: 20.3 % — ABNORMAL HIGH (ref 11.1–18.8)
IgM, Serum: 67 mg/dL (ref 60–263)

## 2008-05-25 LAB — KAPPA/LAMBDA LIGHT CHAINS
Kappa free light chain: 4.19 mg/dL — ABNORMAL HIGH (ref 0.33–1.94)
Kappa:Lambda Ratio: 1.13 (ref 0.26–1.65)
Lambda Free Lght Chn: 3.71 mg/dL — ABNORMAL HIGH (ref 0.57–2.63)

## 2008-06-19 ENCOUNTER — Ambulatory Visit (HOSPITAL_COMMUNITY): Admission: RE | Admit: 2008-06-19 | Discharge: 2008-06-20 | Payer: Self-pay | Admitting: General Surgery

## 2008-06-19 ENCOUNTER — Encounter (INDEPENDENT_AMBULATORY_CARE_PROVIDER_SITE_OTHER): Payer: Self-pay | Admitting: General Surgery

## 2008-06-26 ENCOUNTER — Ambulatory Visit: Payer: Self-pay | Admitting: Oncology

## 2008-07-23 ENCOUNTER — Emergency Department (HOSPITAL_COMMUNITY): Admission: EM | Admit: 2008-07-23 | Discharge: 2008-07-23 | Payer: Self-pay | Admitting: Emergency Medicine

## 2008-07-26 LAB — CBC WITH DIFFERENTIAL/PLATELET
Basophils Absolute: 0 10*3/uL (ref 0.0–0.1)
Eosinophils Absolute: 0.4 10*3/uL (ref 0.0–0.5)
HGB: 10.9 g/dL — ABNORMAL LOW (ref 11.6–15.9)
MCV: 94.2 fL (ref 81.0–101.0)
MONO#: 0.7 10*3/uL (ref 0.1–0.9)
MONO%: 5.7 % (ref 0.0–13.0)
NEUT#: 10.6 10*3/uL — ABNORMAL HIGH (ref 1.5–6.5)
RBC: 3.39 10*6/uL — ABNORMAL LOW (ref 3.70–5.32)
RDW: 14.6 % — ABNORMAL HIGH (ref 11.3–14.5)
WBC: 12.8 10*3/uL — ABNORMAL HIGH (ref 3.9–10.0)

## 2008-07-26 LAB — COMPREHENSIVE METABOLIC PANEL
Albumin: 3.8 g/dL (ref 3.5–5.2)
BUN: 18 mg/dL (ref 6–23)
CO2: 25 mEq/L (ref 19–32)
Calcium: 9.7 mg/dL (ref 8.4–10.5)
Chloride: 99 mEq/L (ref 96–112)
Glucose, Bld: 158 mg/dL — ABNORMAL HIGH (ref 70–99)
Potassium: 3.8 mEq/L (ref 3.5–5.3)
Sodium: 135 mEq/L (ref 135–145)
Total Protein: 7.5 g/dL (ref 6.0–8.3)

## 2008-08-21 ENCOUNTER — Ambulatory Visit: Payer: Self-pay | Admitting: Oncology

## 2008-08-23 LAB — CBC WITH DIFFERENTIAL/PLATELET
Basophils Absolute: 0.1 10*3/uL (ref 0.0–0.1)
EOS%: 2.7 % (ref 0.0–7.0)
Eosinophils Absolute: 0.3 10*3/uL (ref 0.0–0.5)
HGB: 11.3 g/dL — ABNORMAL LOW (ref 11.6–15.9)
MCH: 31.4 pg (ref 26.0–34.0)
MONO%: 5.1 % (ref 0.0–13.0)
NEUT#: 9 10*3/uL — ABNORMAL HIGH (ref 1.5–6.5)
RBC: 3.62 10*6/uL — ABNORMAL LOW (ref 3.70–5.32)
RDW: 13.9 % (ref 11.3–14.5)
lymph#: 1.3 10*3/uL (ref 0.9–3.3)

## 2008-09-22 LAB — CBC WITH DIFFERENTIAL/PLATELET
BASO%: 0.4 % (ref 0.0–2.0)
EOS%: 1.8 % (ref 0.0–7.0)
MCH: 31.5 pg (ref 26.0–34.0)
MCHC: 34.1 g/dL (ref 32.0–36.0)
MCV: 92.4 fL (ref 81.0–101.0)
MONO%: 5.4 % (ref 0.0–13.0)
RBC: 3.99 10*6/uL (ref 3.70–5.32)
RDW: 15.8 % — ABNORMAL HIGH (ref 11.3–14.5)
lymph#: 1.3 10*3/uL (ref 0.9–3.3)

## 2008-10-18 ENCOUNTER — Ambulatory Visit: Payer: Self-pay | Admitting: Oncology

## 2008-10-20 LAB — CBC WITH DIFFERENTIAL/PLATELET
BASO%: 0.2 % (ref 0.0–2.0)
EOS%: 2.3 % (ref 0.0–7.0)
HCT: 34.5 % — ABNORMAL LOW (ref 34.8–46.6)
LYMPH%: 12.9 % — ABNORMAL LOW (ref 14.0–49.7)
MCH: 31.7 pg (ref 25.1–34.0)
MCHC: 34.1 g/dL (ref 31.5–36.0)
MCV: 93.1 fL (ref 79.5–101.0)
MONO%: 4.1 % (ref 0.0–14.0)
NEUT%: 80.5 % — ABNORMAL HIGH (ref 38.4–76.8)
Platelets: 215 10*3/uL (ref 145–400)
lymph#: 1.5 10*3/uL (ref 0.9–3.3)

## 2008-11-14 ENCOUNTER — Inpatient Hospital Stay (HOSPITAL_COMMUNITY): Admission: EM | Admit: 2008-11-14 | Discharge: 2008-11-22 | Payer: Self-pay | Admitting: Emergency Medicine

## 2008-11-20 ENCOUNTER — Ambulatory Visit: Payer: Self-pay | Admitting: Physical Medicine & Rehabilitation

## 2008-11-22 ENCOUNTER — Inpatient Hospital Stay (HOSPITAL_COMMUNITY)
Admission: RE | Admit: 2008-11-22 | Discharge: 2008-12-01 | Payer: Self-pay | Admitting: Physical Medicine & Rehabilitation

## 2008-11-29 ENCOUNTER — Encounter (INDEPENDENT_AMBULATORY_CARE_PROVIDER_SITE_OTHER): Payer: Self-pay | Admitting: Gastroenterology

## 2008-12-26 ENCOUNTER — Ambulatory Visit: Payer: Self-pay | Admitting: Vascular Surgery

## 2008-12-26 ENCOUNTER — Encounter (INDEPENDENT_AMBULATORY_CARE_PROVIDER_SITE_OTHER): Payer: Self-pay | Admitting: Emergency Medicine

## 2008-12-26 ENCOUNTER — Emergency Department (HOSPITAL_COMMUNITY): Admission: EM | Admit: 2008-12-26 | Discharge: 2008-12-26 | Payer: Self-pay | Admitting: Emergency Medicine

## 2009-01-18 IMAGING — CR DG CHEST 2V
2 series · 2 of 2 positions shown · non-contrast
Comparison: none

CLINICAL DATA: Failure to thrive. 
 CHEST - 2 VIEW:

[w chest pa]
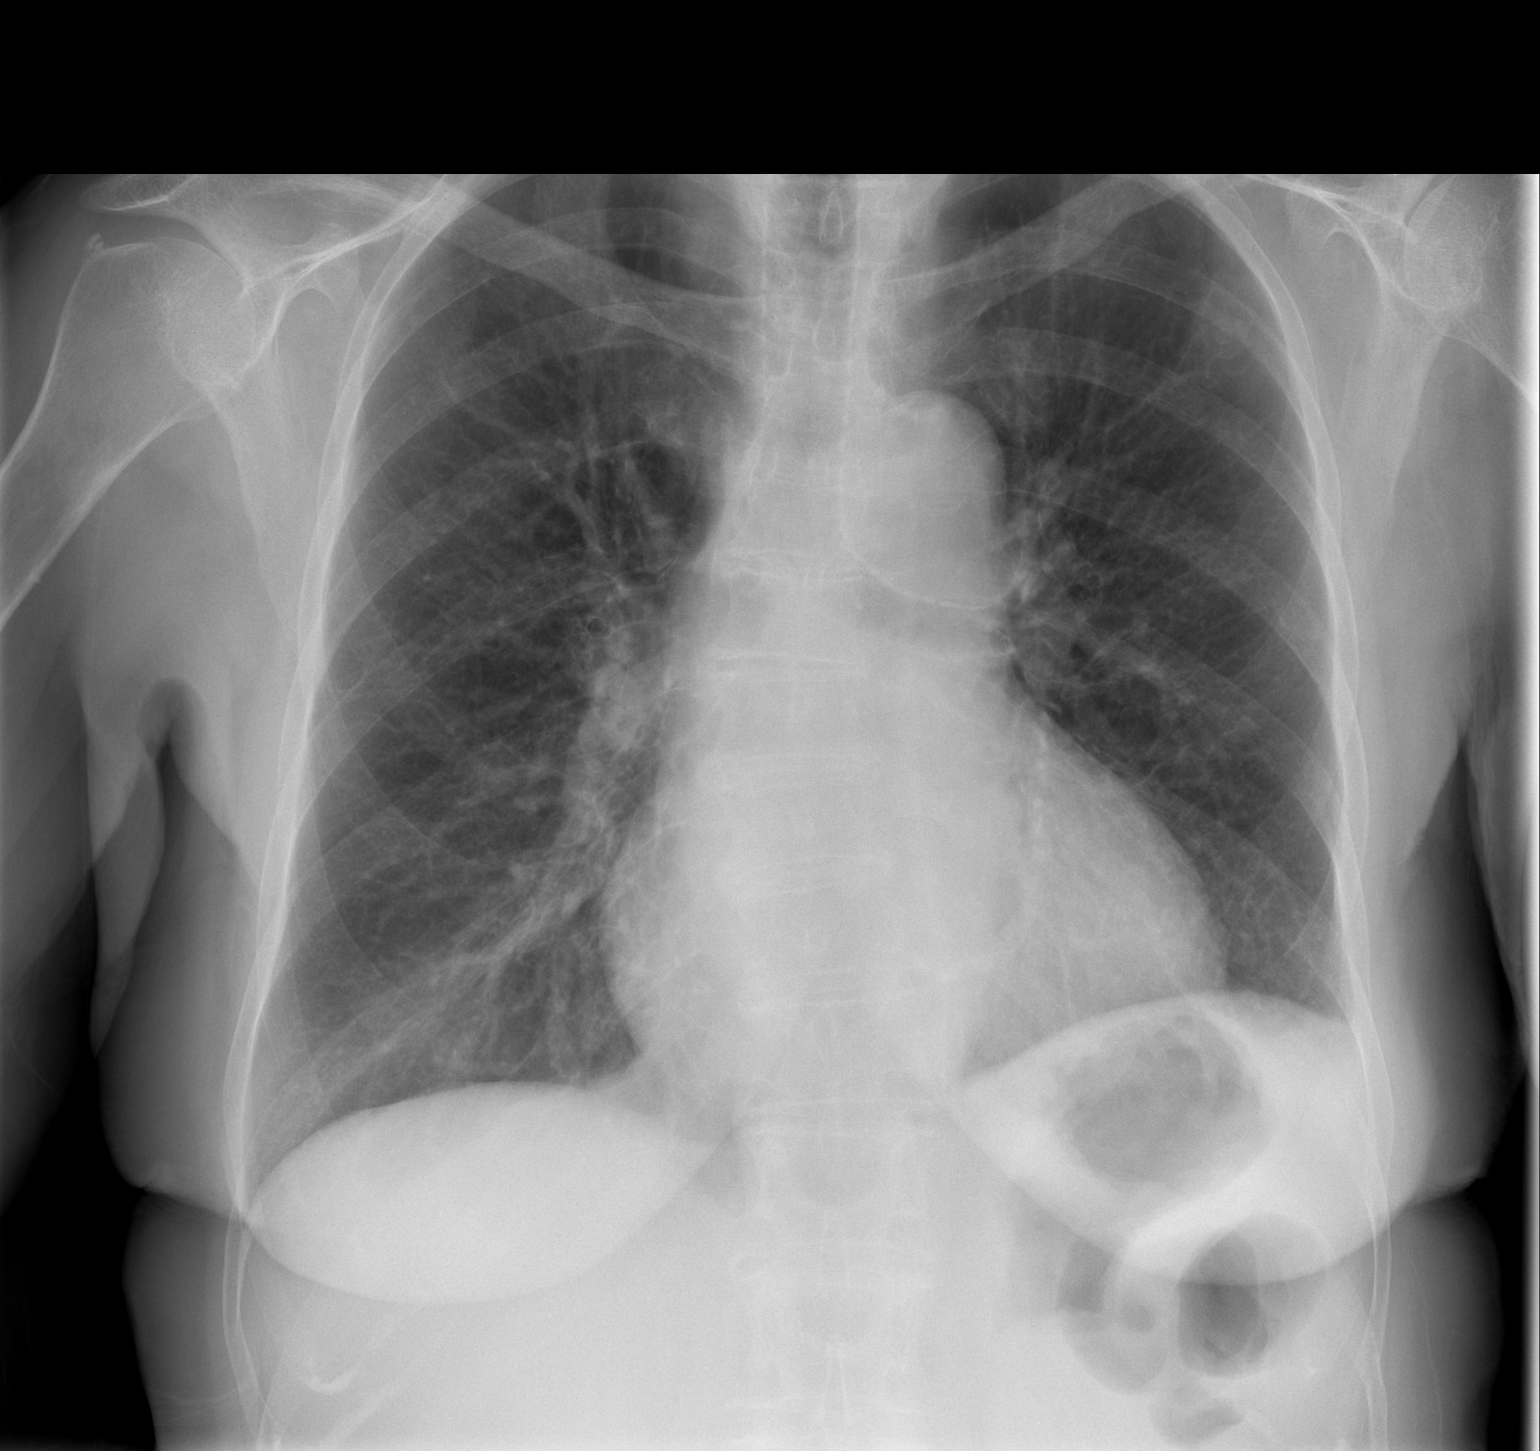

[w chest lat]
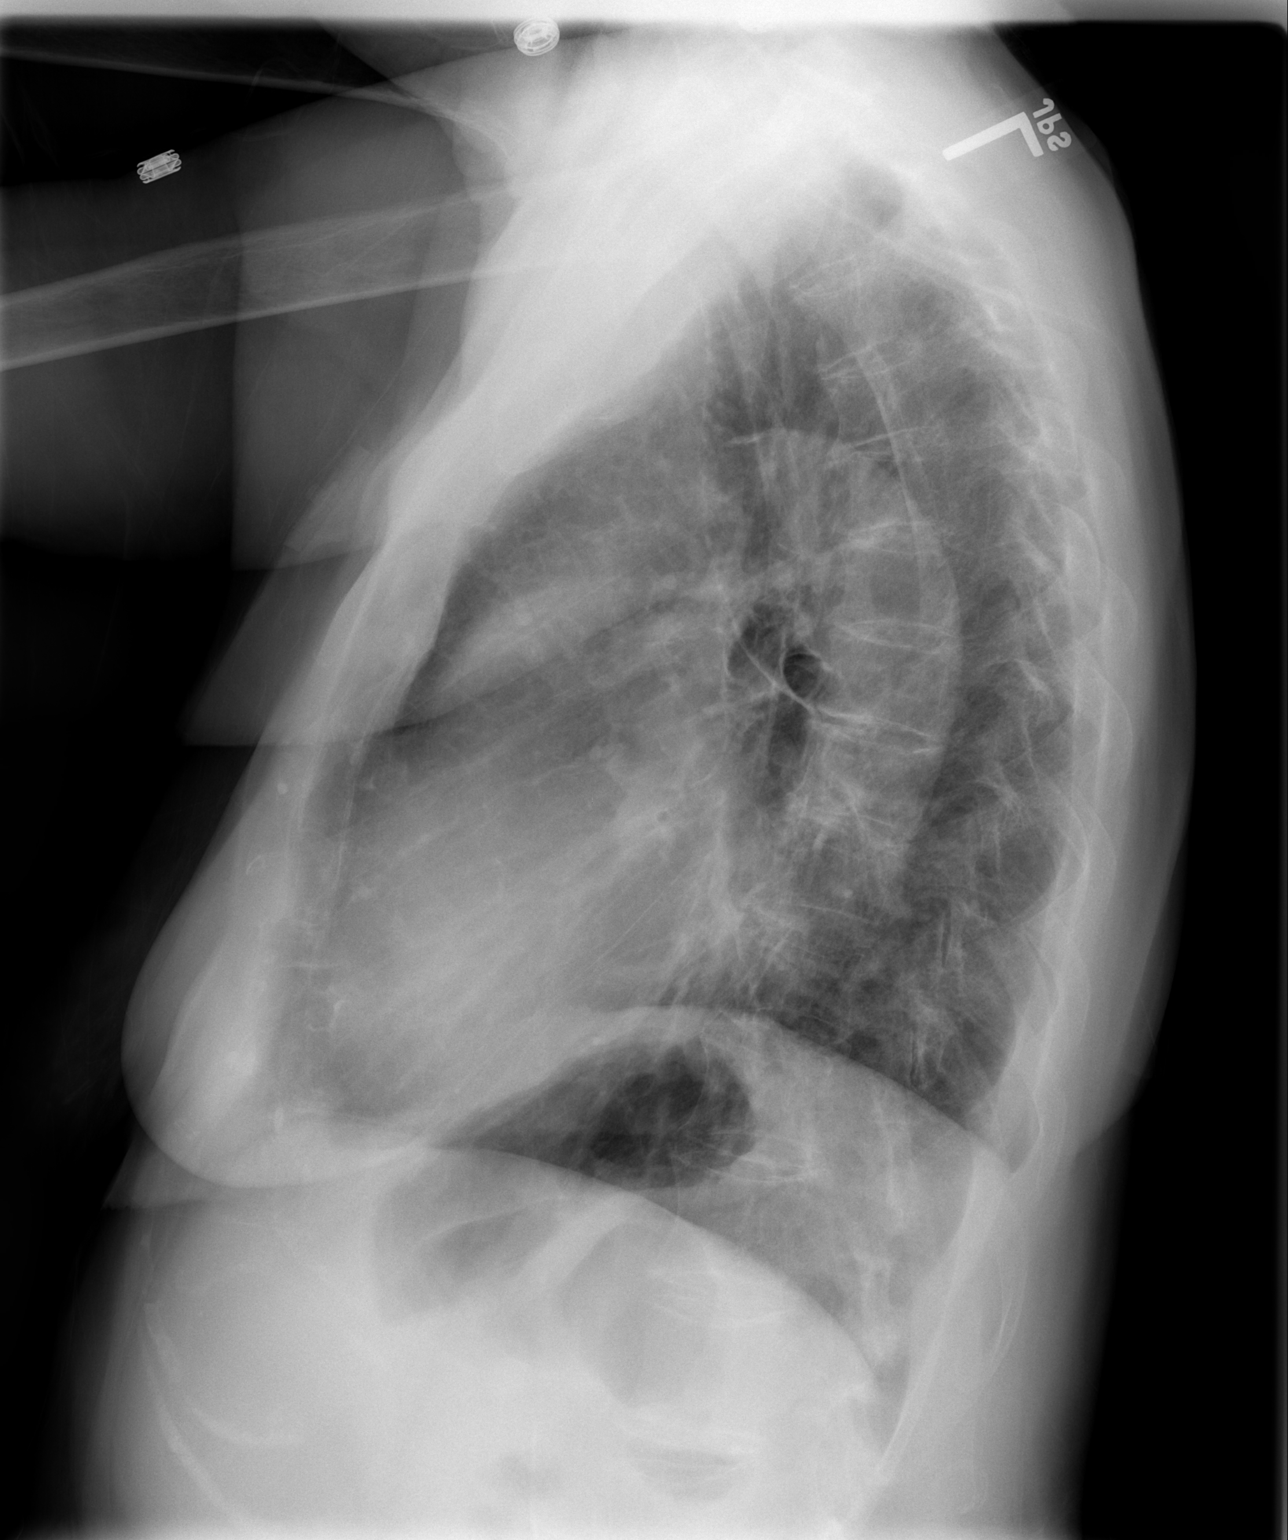

[2 of 2 positions shown; findings below may reference images not displayed]

FINDINGS: The heart is moderately enlarged. Bronchitic changes are stable. Pulmonary vascularity is within normal limits. Lungs are otherwise clear. Bones are demineralized. There is no noticeable compression deformity. No pneumothoraces or effusions are seen. There is persistent elevation of the left hemidiaphragm.
IMPRESSION: Cardiomegaly without CHF. Chronic bronchitic changes. 
 ABDOMEN - 1 VIEW:
FINDINGS: Gas is seen in the stomach, small bowel, and large bowel. There are no disproportionately dilated loops of bowel. Degenerative changes in the SI joints are noted. A right superior pubic ramus insufficiency fracture is noted. Vascular calcifications are present. There is no obvious free intraperitoneal gas.
IMPRESSION: 1.  Nonobstructed bowel gas pattern.
 2.  Right pubic insufficiency fracture.

## 2009-01-18 IMAGING — CR DG ABDOMEN 1V
1 series · 1 of 1 positions shown · non-contrast
Comparison: none

CLINICAL DATA: Failure to thrive. 
 CHEST - 2 VIEW:

[t abdomen supine]
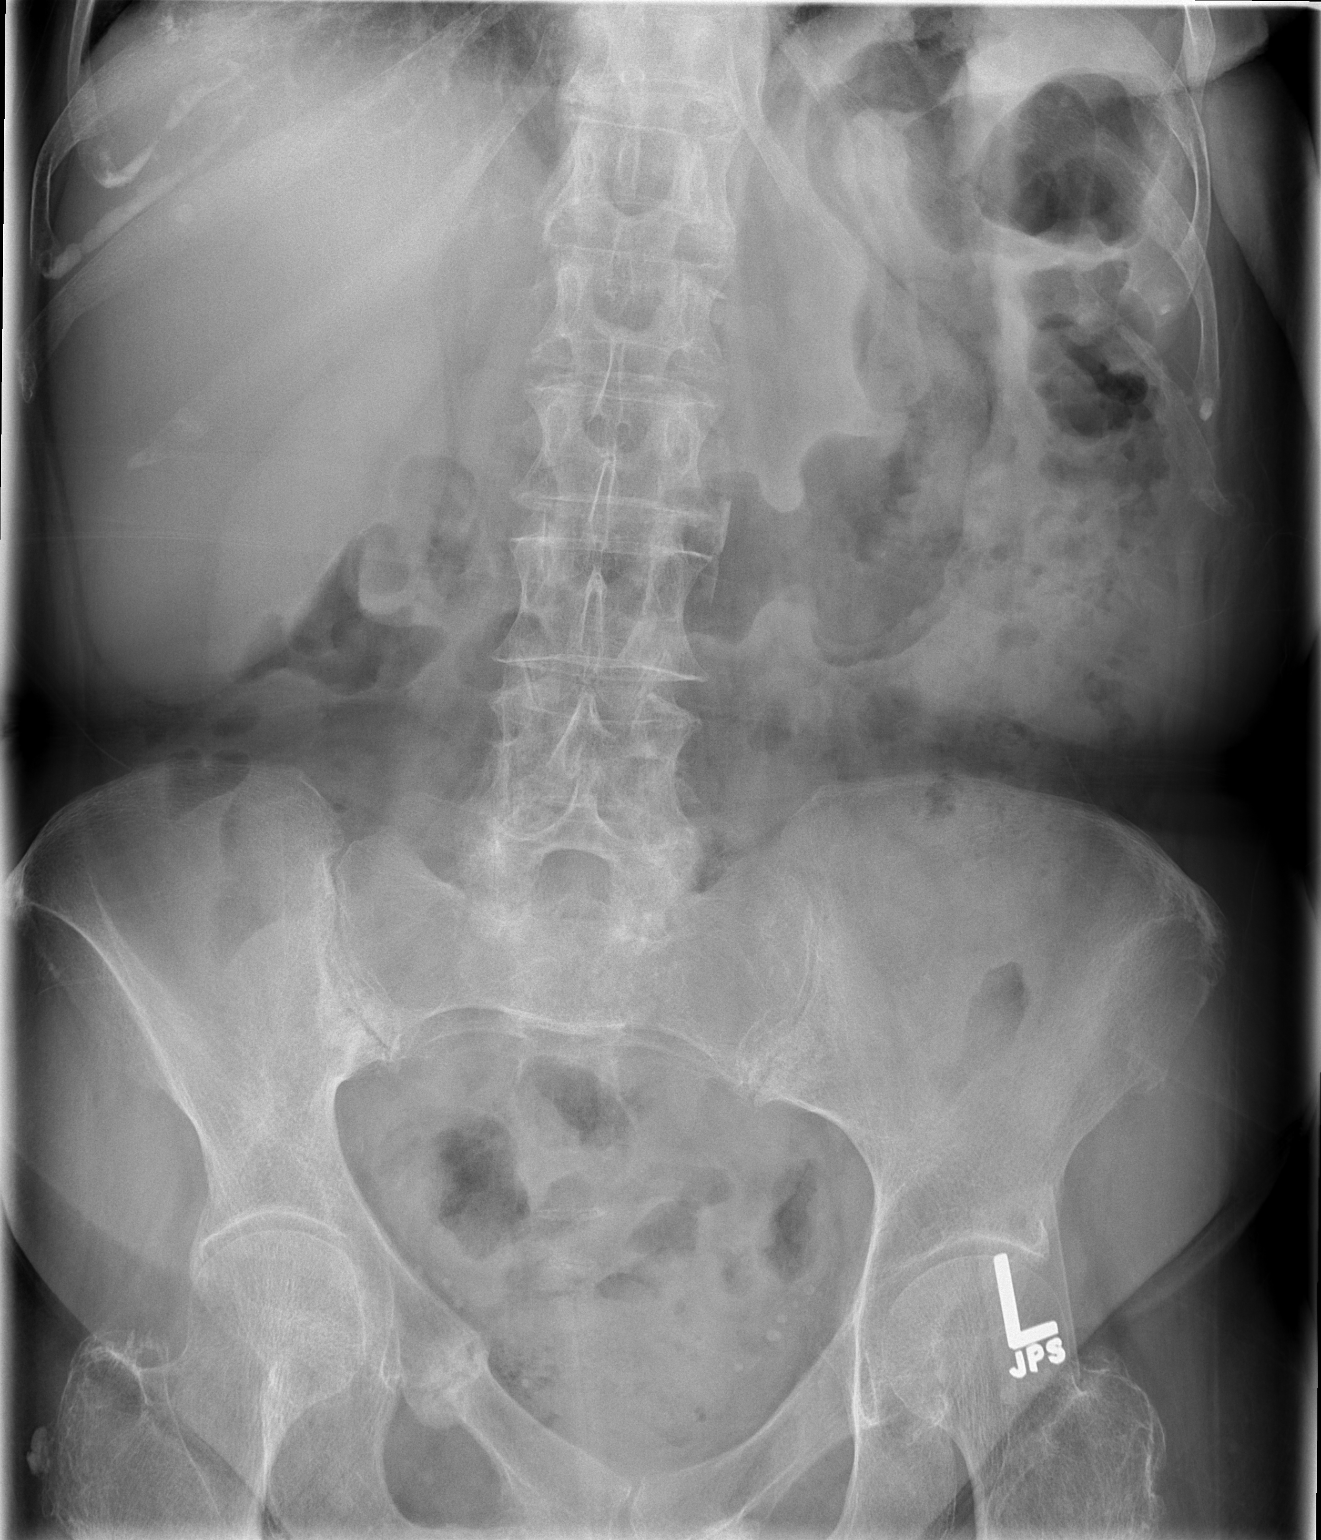

[1 of 1 positions shown; findings below may reference images not displayed]

FINDINGS: The heart is moderately enlarged. Bronchitic changes are stable. Pulmonary vascularity is within normal limits. Lungs are otherwise clear. Bones are demineralized. There is no noticeable compression deformity. No pneumothoraces or effusions are seen. There is persistent elevation of the left hemidiaphragm.
IMPRESSION: Cardiomegaly without CHF. Chronic bronchitic changes. 
 ABDOMEN - 1 VIEW:
FINDINGS: Gas is seen in the stomach, small bowel, and large bowel. There are no disproportionately dilated loops of bowel. Degenerative changes in the SI joints are noted. A right superior pubic ramus insufficiency fracture is noted. Vascular calcifications are present. There is no obvious free intraperitoneal gas.
IMPRESSION: 1.  Nonobstructed bowel gas pattern.
 2.  Right pubic insufficiency fracture.

## 2009-03-06 ENCOUNTER — Ambulatory Visit: Payer: Self-pay | Admitting: Vascular Surgery

## 2009-06-11 ENCOUNTER — Ambulatory Visit: Payer: Self-pay | Admitting: Vascular Surgery

## 2009-06-29 ENCOUNTER — Encounter (INDEPENDENT_AMBULATORY_CARE_PROVIDER_SITE_OTHER): Payer: Self-pay | Admitting: Emergency Medicine

## 2009-06-29 ENCOUNTER — Inpatient Hospital Stay (HOSPITAL_COMMUNITY): Admission: EM | Admit: 2009-06-29 | Discharge: 2009-07-09 | Payer: Self-pay | Admitting: Emergency Medicine

## 2009-06-29 ENCOUNTER — Ambulatory Visit: Payer: Self-pay | Admitting: Vascular Surgery

## 2009-09-21 ENCOUNTER — Inpatient Hospital Stay (HOSPITAL_COMMUNITY): Admission: EM | Admit: 2009-09-21 | Discharge: 2009-09-28 | Payer: Self-pay | Admitting: Emergency Medicine

## 2010-04-03 IMAGING — US US ABDOMEN COMPLETE
1 series · 14 of 25 positions shown · non-contrast
Comparison: None

CLINICAL DATA: Abdominal pain and nausea.

ABDOMEN ULTRASOUND
TECHNIQUE: Complete abdominal ultrasound examination was performed
including evaluation of the liver, gallbladder, bile ducts,
pancreas, kidneys, spleen, IVC, and abdominal aorta.

[Series 1: unknown · 0.28mm/px · 14 of 70 slices shown]
[im 1/70]
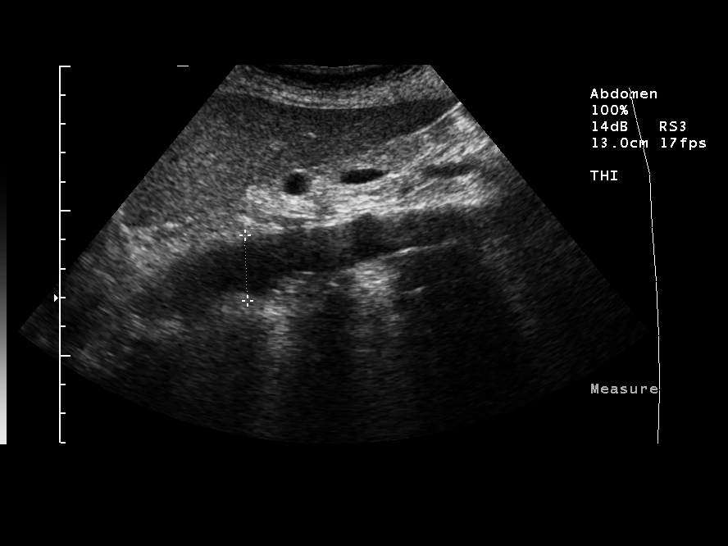
[im 6/70]
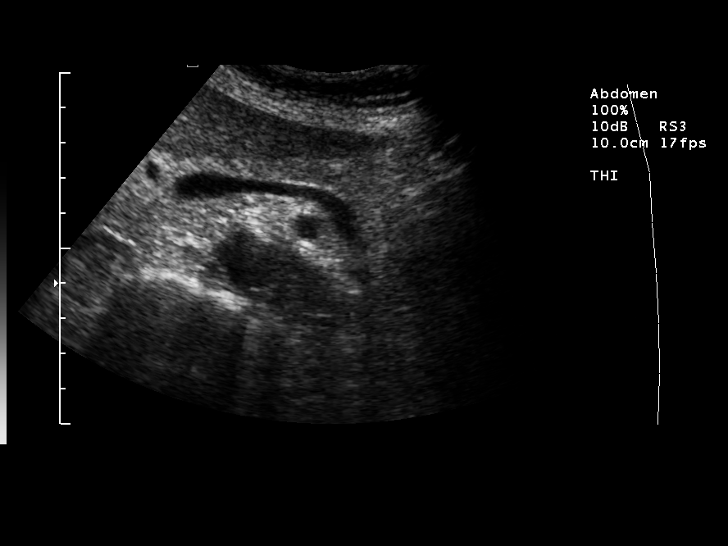
[im 12/70]
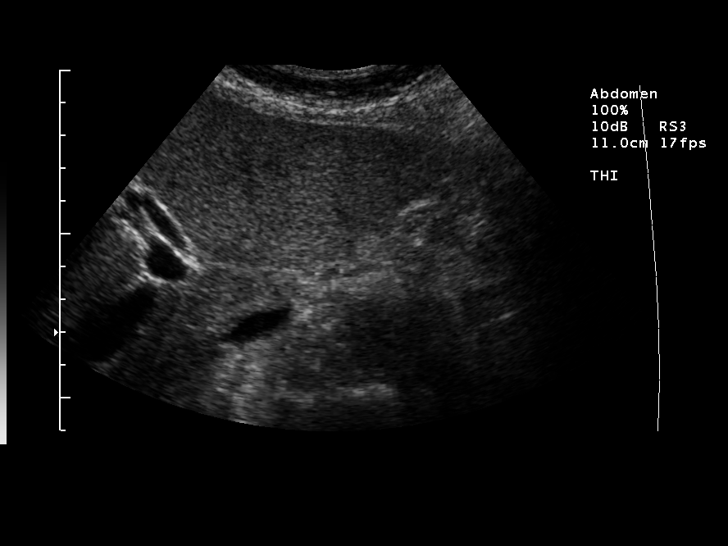
[im 18/70]
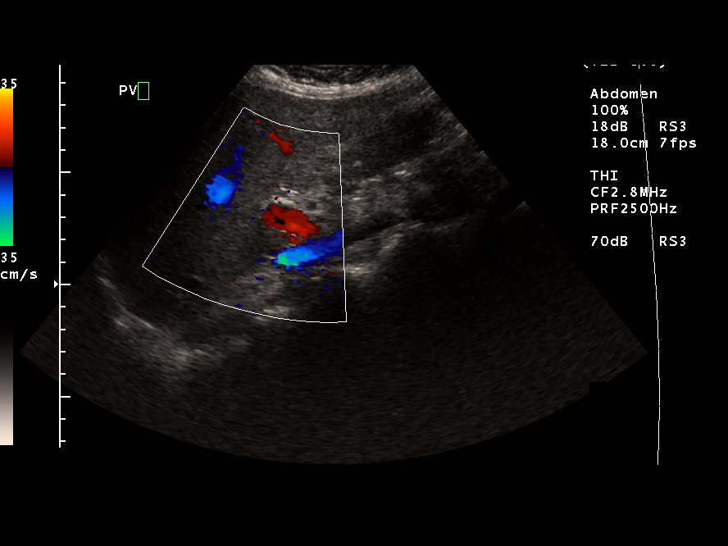
[im 24/70]
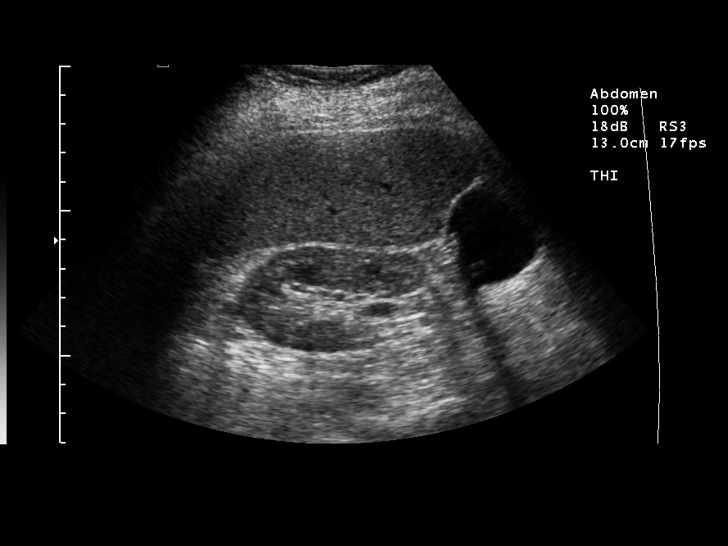
[im 26/70]
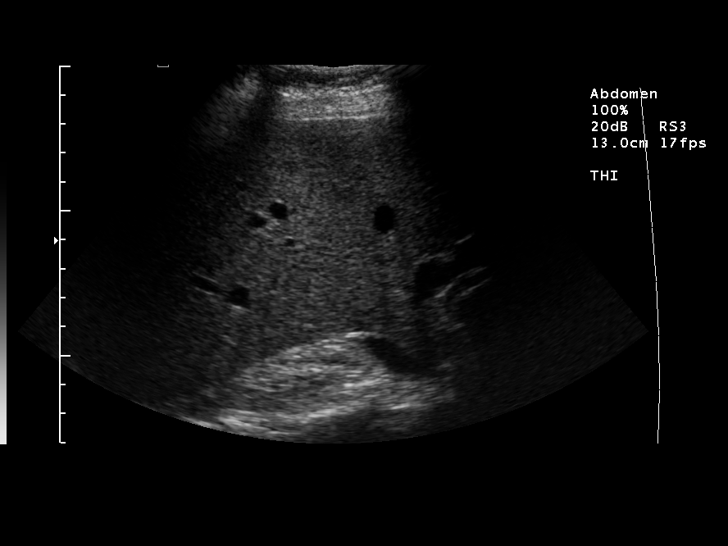
[im 32/70]
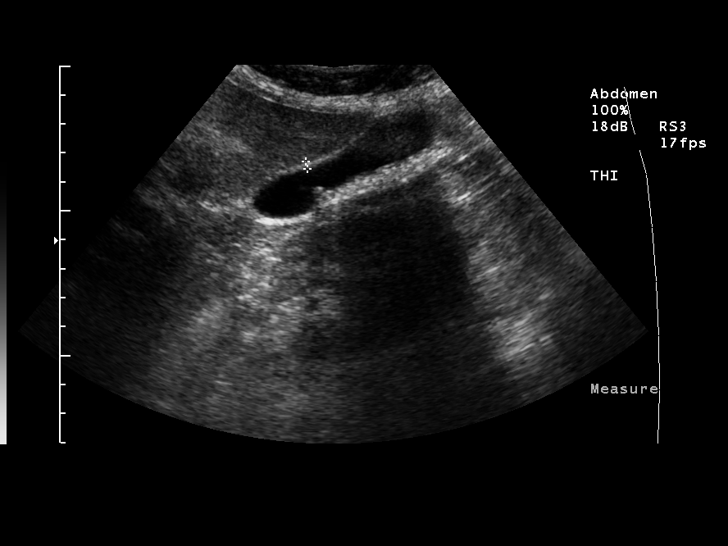
[im 38/70]
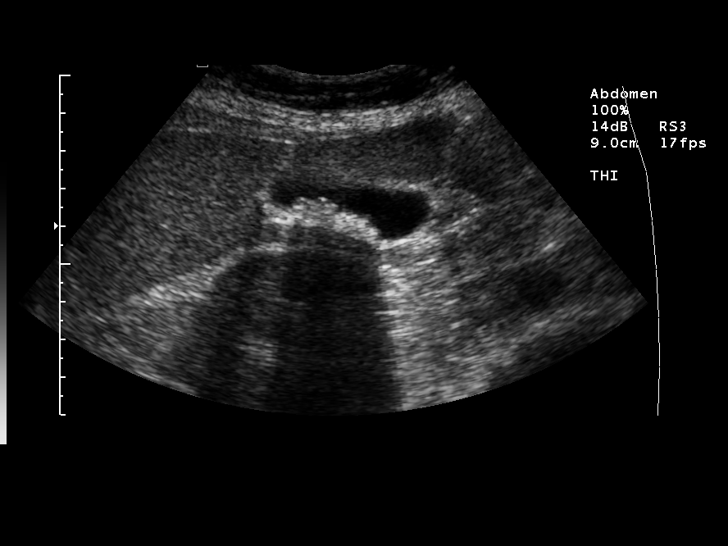
[im 44/70]
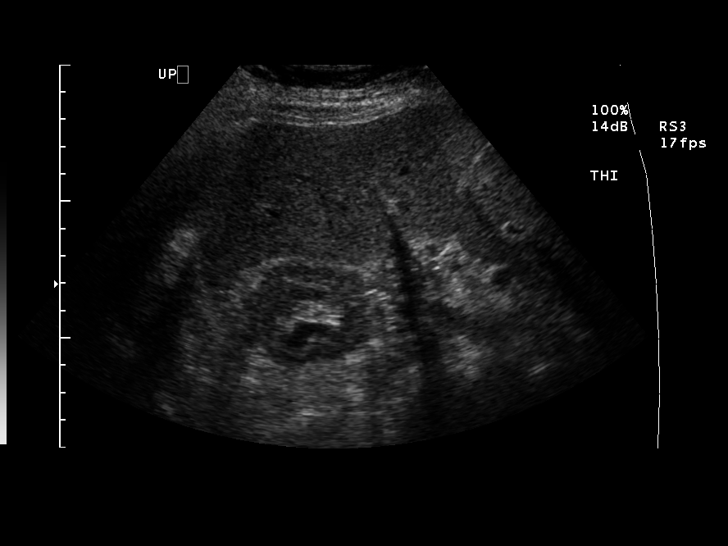
[im 47/70]
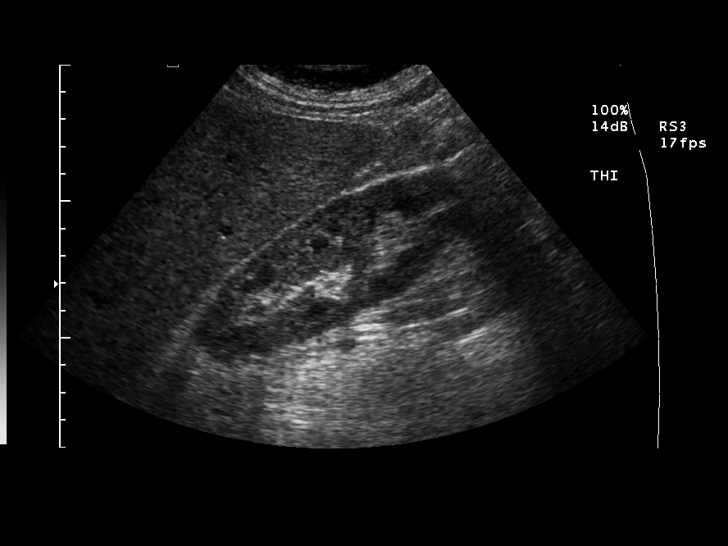
[im 52/70]
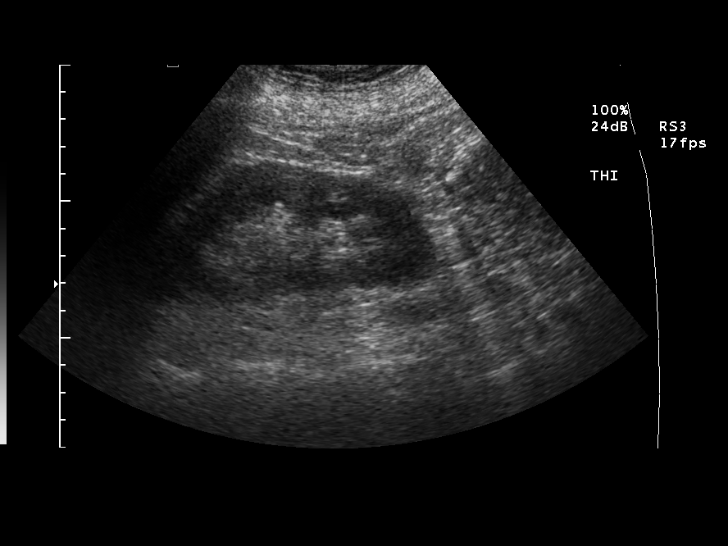
[im 58/70]
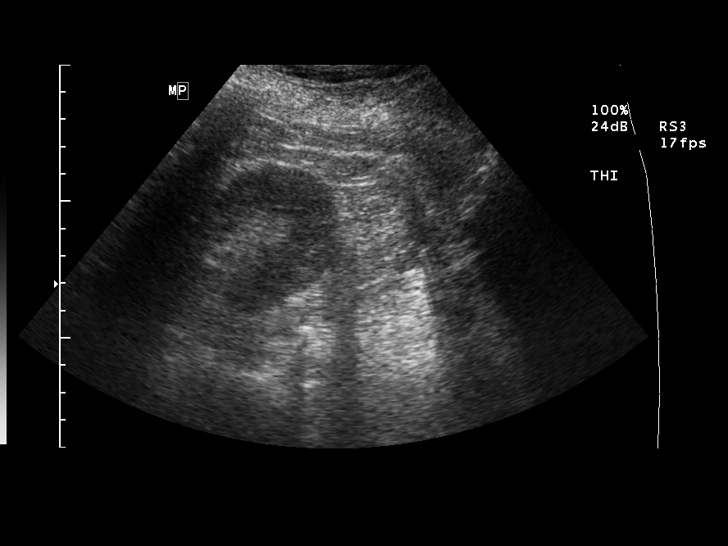
[im 64/70]
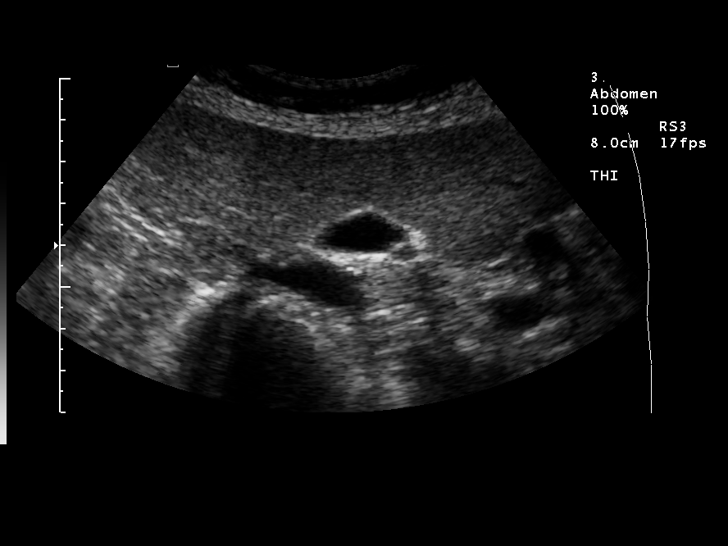
[im 70/70]
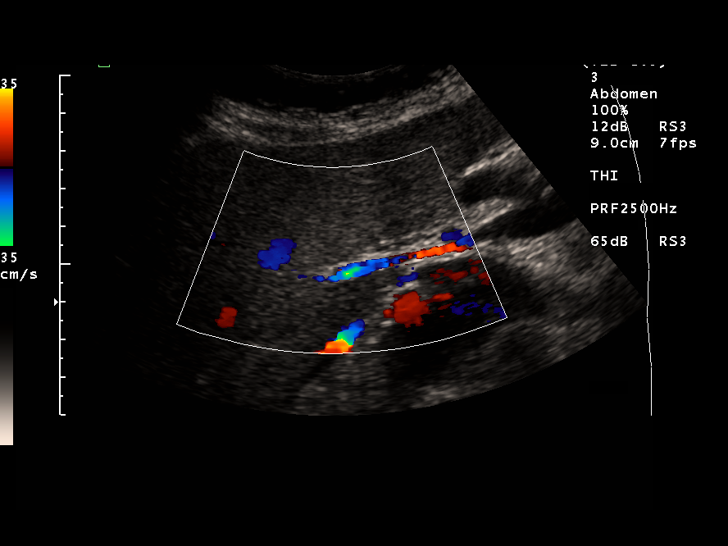

[14 of 25 positions shown; findings below may reference images not displayed]

FINDINGS: Multiple tiny gallstones are seen.  There is no evidence
of gallbladder dilatation or wall thickening.  No sonographic
Murphy's sign is noted by the sonographer.  There is no evidence of
biliary ductal dilatation, with common bile duct measuring 3 mm.
The liver is within normal limits in echogenicity, and no liver
masses are identified.  Visualized portions of the IVC pancreas are
unremarkable.

There is no evidence of splenomegaly.  Both kidneys are normal in
size in appearance.  There is no evidence of renal mass or
hydronephrosis.  There is no evidence of abdominal aortic aneurysm
or ascites.
IMPRESSION: 1.  Cholelithiasis.
2.  No sonographic evidence of acute cholecystitis or biliary
dilatation.

## 2010-05-30 ENCOUNTER — Emergency Department (HOSPITAL_COMMUNITY): Admission: EM | Admit: 2010-05-30 | Discharge: 2010-05-31 | Payer: Self-pay | Admitting: Emergency Medicine

## 2010-06-02 ENCOUNTER — Emergency Department (HOSPITAL_COMMUNITY): Admission: EM | Admit: 2010-06-02 | Discharge: 2010-06-02 | Payer: Self-pay | Admitting: Emergency Medicine

## 2010-06-02 ENCOUNTER — Encounter (INDEPENDENT_AMBULATORY_CARE_PROVIDER_SITE_OTHER): Payer: Self-pay | Admitting: Emergency Medicine

## 2010-06-02 ENCOUNTER — Ambulatory Visit: Payer: Self-pay | Admitting: Vascular Surgery

## 2010-09-23 IMAGING — CR DG LUMBAR SPINE COMPLETE 4+V
5 series · 5 of 5 positions shown · non-contrast
Comparison: None

CLINICAL DATA: Low back pain.  Confusion.

LUMBAR SPINE - COMPLETE 4+ VIEW

[t l-spine a.p.]
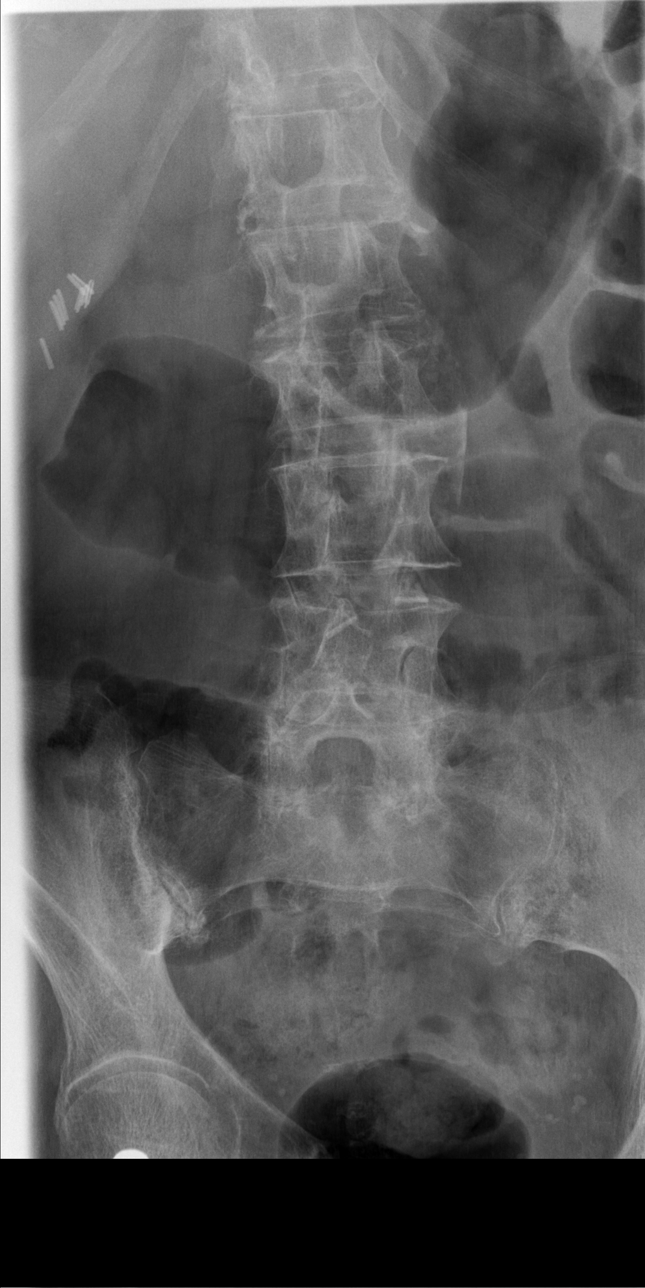

[t l-spine oblique exposure (1 of 2)]
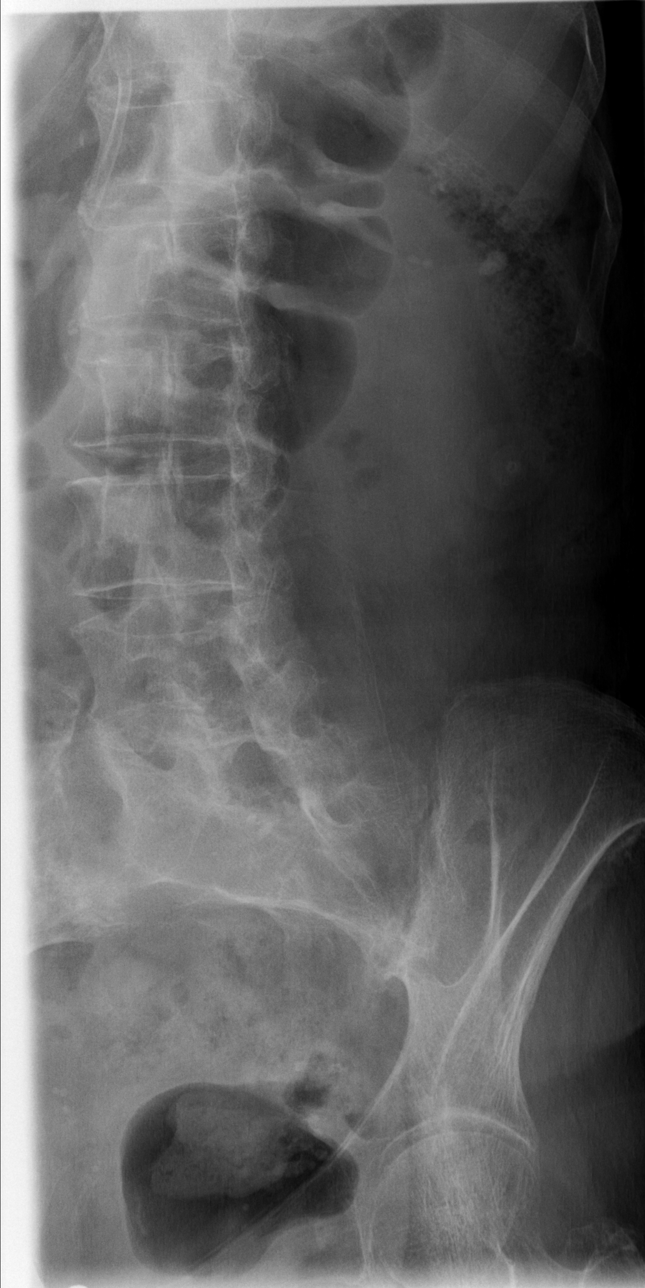

[t l-spine oblique exposure (2 of 2)]
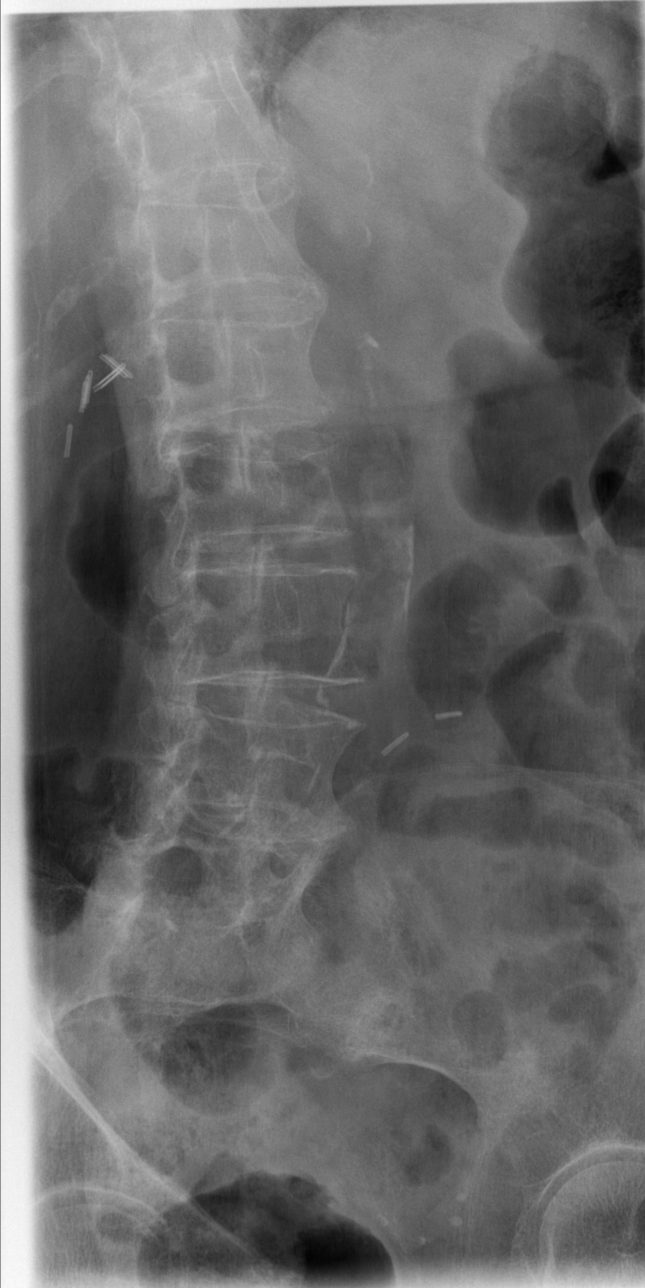

[t l-spine lat]
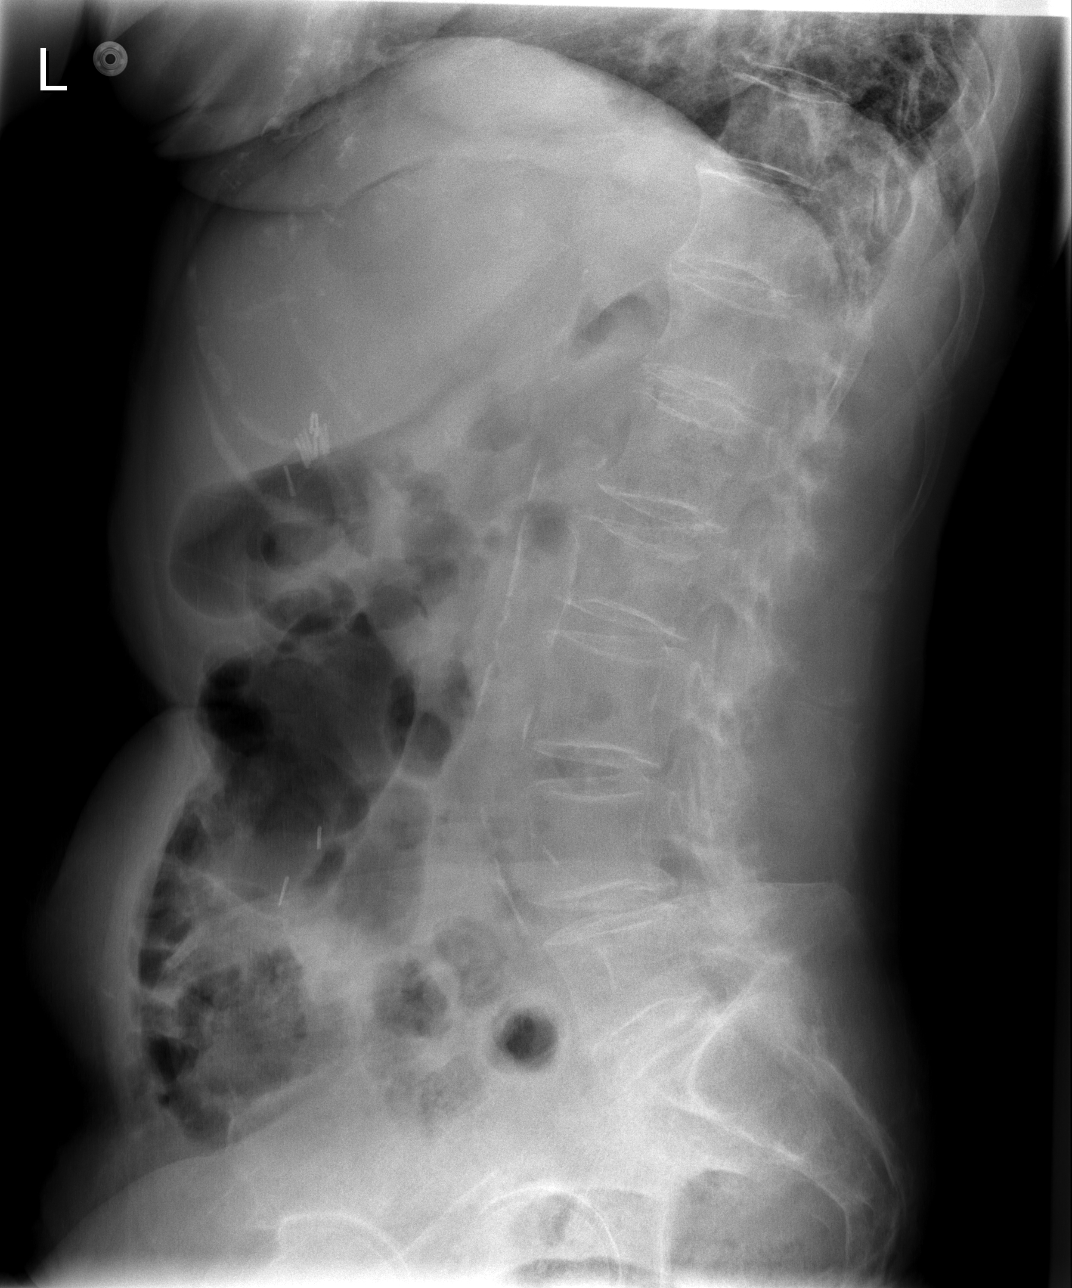

[t l-spine l5-s1 spot]
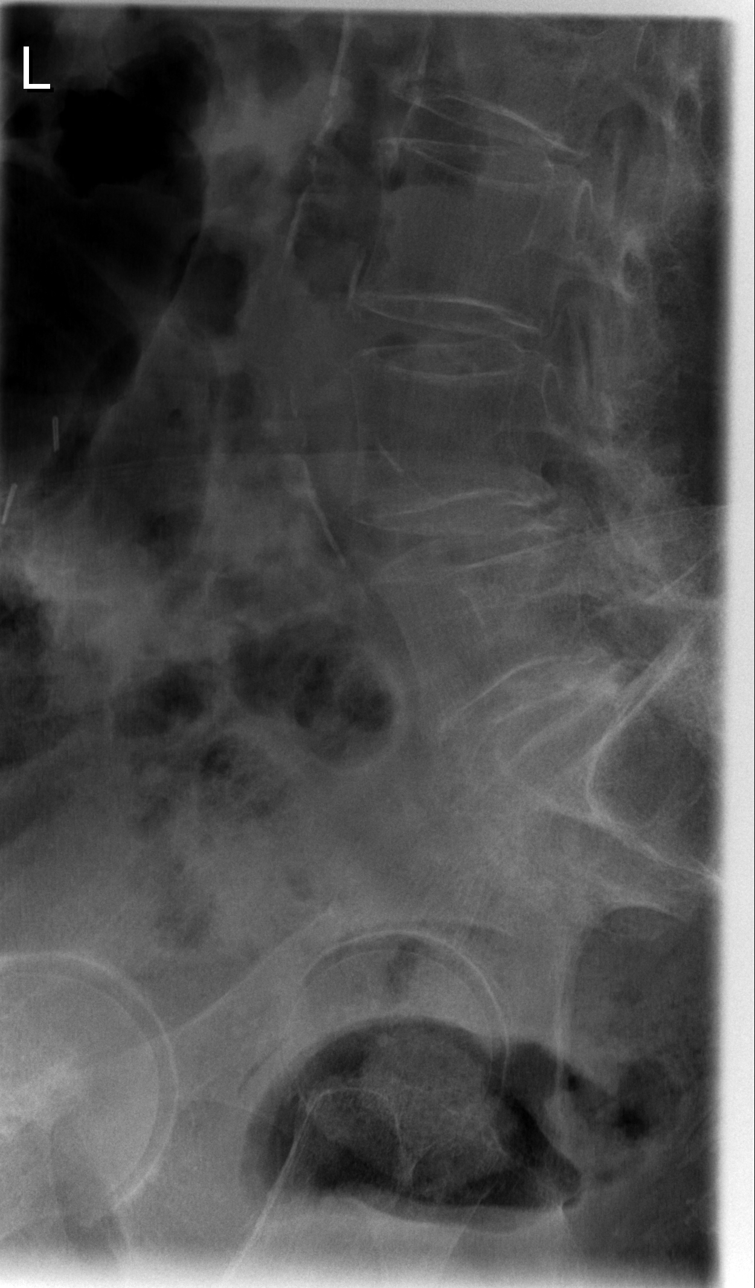

[5 of 5 positions shown; findings below may reference images not displayed]

FINDINGS: There is a mild curvature of the lumbar spine which is
convex to the left. There is mild loss of vertebral body height at
the L2 level.  This represents an age indeterminate compression
deformity but appears new from 07/25/2006.

No retropulsion of fracture fragments.

There is degenerative disc disease at the L5-S1 level.
IMPRESSION: 1.  Mild age indeterminate compression deformity involves the L2
vertebra.  No retropulsion of fracture fragments noted

## 2010-10-10 ENCOUNTER — Emergency Department (HOSPITAL_COMMUNITY): Payer: Medicare Other

## 2010-10-10 ENCOUNTER — Emergency Department (HOSPITAL_COMMUNITY)
Admission: EM | Admit: 2010-10-10 | Discharge: 2010-10-10 | Disposition: A | Discharge status: Home / Self Care | Payer: Medicare Other | Attending: Emergency Medicine | Admitting: Emergency Medicine

## 2010-10-10 DIAGNOSIS — R609 Edema, unspecified: Secondary | ICD-10-CM | POA: Insufficient documentation

## 2010-10-10 DIAGNOSIS — Z8639 Personal history of other endocrine, nutritional and metabolic disease: Secondary | ICD-10-CM | POA: Insufficient documentation

## 2010-10-10 DIAGNOSIS — E785 Hyperlipidemia, unspecified: Secondary | ICD-10-CM | POA: Insufficient documentation

## 2010-10-10 DIAGNOSIS — I8390 Asymptomatic varicose veins of unspecified lower extremity: Secondary | ICD-10-CM | POA: Insufficient documentation

## 2010-10-10 DIAGNOSIS — F411 Generalized anxiety disorder: Secondary | ICD-10-CM | POA: Insufficient documentation

## 2010-10-10 DIAGNOSIS — E039 Hypothyroidism, unspecified: Secondary | ICD-10-CM | POA: Insufficient documentation

## 2010-10-10 DIAGNOSIS — R109 Unspecified abdominal pain: Secondary | ICD-10-CM | POA: Insufficient documentation

## 2010-10-10 DIAGNOSIS — M79609 Pain in unspecified limb: Secondary | ICD-10-CM | POA: Insufficient documentation

## 2010-10-10 DIAGNOSIS — M329 Systemic lupus erythematosus, unspecified: Secondary | ICD-10-CM | POA: Insufficient documentation

## 2010-10-10 DIAGNOSIS — I10 Essential (primary) hypertension: Secondary | ICD-10-CM | POA: Insufficient documentation

## 2010-10-10 DIAGNOSIS — F068 Other specified mental disorders due to known physiological condition: Secondary | ICD-10-CM | POA: Insufficient documentation

## 2010-10-10 DIAGNOSIS — R079 Chest pain, unspecified: Secondary | ICD-10-CM | POA: Insufficient documentation

## 2010-10-10 DIAGNOSIS — K219 Gastro-esophageal reflux disease without esophagitis: Secondary | ICD-10-CM | POA: Insufficient documentation

## 2010-10-10 DIAGNOSIS — Z862 Personal history of diseases of the blood and blood-forming organs and certain disorders involving the immune mechanism: Secondary | ICD-10-CM | POA: Insufficient documentation

## 2010-10-10 DIAGNOSIS — M810 Age-related osteoporosis without current pathological fracture: Secondary | ICD-10-CM | POA: Insufficient documentation

## 2010-10-10 DIAGNOSIS — G8929 Other chronic pain: Secondary | ICD-10-CM | POA: Insufficient documentation

## 2010-10-10 DIAGNOSIS — K589 Irritable bowel syndrome without diarrhea: Secondary | ICD-10-CM | POA: Insufficient documentation

## 2010-10-10 DIAGNOSIS — Z79899 Other long term (current) drug therapy: Secondary | ICD-10-CM | POA: Insufficient documentation

## 2010-10-10 DIAGNOSIS — R0602 Shortness of breath: Secondary | ICD-10-CM | POA: Insufficient documentation

## 2010-10-10 DIAGNOSIS — R0989 Other specified symptoms and signs involving the circulatory and respiratory systems: Secondary | ICD-10-CM | POA: Insufficient documentation

## 2010-10-10 DIAGNOSIS — E119 Type 2 diabetes mellitus without complications: Secondary | ICD-10-CM | POA: Insufficient documentation

## 2010-10-10 LAB — CBC
HCT: 31.8 % — ABNORMAL LOW (ref 36.0–46.0)
Hemoglobin: 10.1 g/dL — ABNORMAL LOW (ref 12.0–15.0)
MCH: 30.6 pg (ref 26.0–34.0)
MCHC: 31.8 g/dL (ref 30.0–36.0)

## 2010-10-10 LAB — URINALYSIS, ROUTINE W REFLEX MICROSCOPIC
Bilirubin Urine: NEGATIVE
Ketones, ur: NEGATIVE mg/dL
Urine Glucose, Fasting: NEGATIVE mg/dL
pH: 7.5 (ref 5.0–8.0)

## 2010-10-10 LAB — BASIC METABOLIC PANEL
GFR calc non Af Amer: 38 mL/min — ABNORMAL LOW (ref >60)
Potassium: 4.1 mEq/L (ref 3.5–5.1)
Sodium: 139 mEq/L (ref 135–145)

## 2010-10-10 LAB — DIFFERENTIAL
Basophils Relative: 1 % (ref 0–1)
Lymphocytes Relative: 24 % (ref 12–46)
Monocytes Absolute: 1 10*3/uL (ref 0.1–1.0)
Monocytes Relative: 11 % (ref 3–12)
Neutro Abs: 5.5 10*3/uL (ref 1.7–7.7)

## 2010-10-10 LAB — POCT CARDIAC MARKERS
CKMB, poc: <1 ng/mL — ABNORMAL LOW (ref 1.0–8.0)
Troponin i, poc: <0.05 ng/mL (ref 0.00–0.09)

## 2010-10-10 LAB — URINE MICROSCOPIC-ADD ON

## 2010-10-12 LAB — URINE CULTURE: Colony Count: >=100000

## 2010-10-23 LAB — POCT I-STAT, CHEM 8
BUN: 37 mg/dL — ABNORMAL HIGH (ref 6–23)
Creatinine, Ser: 1.8 mg/dL — ABNORMAL HIGH (ref 0.4–1.2)
HCT: 34 % — ABNORMAL LOW (ref 36.0–46.0)
HCT: 36 % (ref 36.0–46.0)
Hemoglobin: 11.6 g/dL — ABNORMAL LOW (ref 12.0–15.0)
Potassium: 4.6 mEq/L (ref 3.5–5.1)
Sodium: 129 mEq/L — ABNORMAL LOW (ref 135–145)
Sodium: 133 mEq/L — ABNORMAL LOW (ref 135–145)
TCO2: 27 mmol/L (ref 0–100)

## 2010-10-23 LAB — PROTIME-INR
INR: 1.94 — ABNORMAL HIGH (ref 0.00–1.49)
Prothrombin Time: 22.3 seconds — ABNORMAL HIGH (ref 11.6–15.2)

## 2010-10-23 LAB — CBC
HCT: 33.7 % — ABNORMAL LOW (ref 36.0–46.0)
Hemoglobin: 11.3 g/dL — ABNORMAL LOW (ref 12.0–15.0)
RDW: 14.7 % (ref 11.5–15.5)
WBC: 8.1 10*3/uL (ref 4.0–10.5)

## 2010-10-30 LAB — COMPREHENSIVE METABOLIC PANEL
ALT: 12 U/L (ref 0–35)
AST: 16 U/L (ref 0–37)
AST: 17 U/L (ref 0–37)
AST: 21 U/L (ref 0–37)
Albumin: 2.4 g/dL — ABNORMAL LOW (ref 3.5–5.2)
Albumin: 2.6 g/dL — ABNORMAL LOW (ref 3.5–5.2)
Albumin: 3.2 g/dL — ABNORMAL LOW (ref 3.5–5.2)
BUN: 28 mg/dL — ABNORMAL HIGH (ref 6–23)
CO2: 25 mEq/L (ref 19–32)
CO2: 26 mEq/L (ref 19–32)
Calcium: 9.5 mg/dL (ref 8.4–10.5)
Calcium: 9.5 mg/dL (ref 8.4–10.5)
Chloride: 100 mEq/L (ref 96–112)
Chloride: 103 mEq/L (ref 96–112)
Creatinine, Ser: 0.8 mg/dL (ref 0.4–1.2)
Creatinine, Ser: 1.13 mg/dL (ref 0.4–1.2)
Creatinine, Ser: 1.16 mg/dL (ref 0.4–1.2)
GFR calc Af Amer: 55 mL/min — ABNORMAL LOW (ref >60)
GFR calc Af Amer: 57 mL/min — ABNORMAL LOW (ref >60)
GFR calc Af Amer: >60 mL/min (ref >60)
GFR calc non Af Amer: 47 mL/min — ABNORMAL LOW (ref >60)
GFR calc non Af Amer: >60 mL/min (ref >60)
Sodium: 134 mEq/L — ABNORMAL LOW (ref 135–145)
Total Bilirubin: 0.6 mg/dL (ref 0.3–1.2)
Total Protein: 8.4 g/dL — ABNORMAL HIGH (ref 6.0–8.3)

## 2010-10-30 LAB — CARDIAC PANEL(CRET KIN+CKTOT+MB+TROPI)
CK, MB: 2.1 ng/mL (ref 0.3–4.0)
Relative Index: 1.6 (ref 0.0–2.5)
Relative Index: INVALID (ref 0.0–2.5)
Total CK: 132 U/L (ref 7–177)
Troponin I: 0.06 ng/mL (ref 0.00–0.06)

## 2010-10-30 LAB — BASIC METABOLIC PANEL
BUN: 37 mg/dL — ABNORMAL HIGH (ref 6–23)
CO2: 25 mEq/L (ref 19–32)
Calcium: 8.9 mg/dL (ref 8.4–10.5)
Creatinine, Ser: 1.07 mg/dL (ref 0.4–1.2)
GFR calc Af Amer: >60 mL/min (ref >60)

## 2010-10-30 LAB — GLUCOSE, CAPILLARY
Glucose-Capillary: 108 mg/dL — ABNORMAL HIGH (ref 70–99)
Glucose-Capillary: 113 mg/dL — ABNORMAL HIGH (ref 70–99)
Glucose-Capillary: 114 mg/dL — ABNORMAL HIGH (ref 70–99)
Glucose-Capillary: 131 mg/dL — ABNORMAL HIGH (ref 70–99)
Glucose-Capillary: 140 mg/dL — ABNORMAL HIGH (ref 70–99)
Glucose-Capillary: 156 mg/dL — ABNORMAL HIGH (ref 70–99)
Glucose-Capillary: 166 mg/dL — ABNORMAL HIGH (ref 70–99)
Glucose-Capillary: 167 mg/dL — ABNORMAL HIGH (ref 70–99)
Glucose-Capillary: 187 mg/dL — ABNORMAL HIGH (ref 70–99)
Glucose-Capillary: 207 mg/dL — ABNORMAL HIGH (ref 70–99)
Glucose-Capillary: 207 mg/dL — ABNORMAL HIGH (ref 70–99)
Glucose-Capillary: 214 mg/dL — ABNORMAL HIGH (ref 70–99)
Glucose-Capillary: 248 mg/dL — ABNORMAL HIGH (ref 70–99)
Glucose-Capillary: 271 mg/dL — ABNORMAL HIGH (ref 70–99)
Glucose-Capillary: 273 mg/dL — ABNORMAL HIGH (ref 70–99)
Glucose-Capillary: 94 mg/dL (ref 70–99)

## 2010-10-30 LAB — CBC
HCT: 32.9 % — ABNORMAL LOW (ref 36.0–46.0)
MCHC: 34.7 g/dL (ref 30.0–36.0)
MCHC: 34.7 g/dL (ref 30.0–36.0)
MCV: 94 fL (ref 78.0–100.0)
MCV: 94.5 fL (ref 78.0–100.0)
MCV: 94.7 fL (ref 78.0–100.0)
Platelets: 174 10*3/uL (ref 150–400)
Platelets: 175 10*3/uL (ref 150–400)
Platelets: 183 10*3/uL (ref 150–400)
Platelets: 200 10*3/uL (ref 150–400)
RBC: 3.07 MIL/uL — ABNORMAL LOW (ref 3.87–5.11)
RBC: 3.29 MIL/uL — ABNORMAL LOW (ref 3.87–5.11)
RDW: 13.5 % (ref 11.5–15.5)
WBC: 15.8 10*3/uL — ABNORMAL HIGH (ref 4.0–10.5)
WBC: 8.7 10*3/uL (ref 4.0–10.5)

## 2010-10-30 LAB — DIFFERENTIAL
Eosinophils Relative: 0 % (ref 0–5)
Lymphocytes Relative: 4 % — ABNORMAL LOW (ref 12–46)
Lymphs Abs: 0.6 10*3/uL — ABNORMAL LOW (ref 0.7–4.0)
Monocytes Absolute: 1.3 10*3/uL — ABNORMAL HIGH (ref 0.1–1.0)
Monocytes Relative: 8 % (ref 3–12)

## 2010-10-30 LAB — TSH: TSH: 0.241 u[IU]/mL — ABNORMAL LOW (ref 0.350–4.500)

## 2010-10-30 LAB — URINALYSIS, ROUTINE W REFLEX MICROSCOPIC
Ketones, ur: NEGATIVE mg/dL
Leukocytes, UA: NEGATIVE
Nitrite: NEGATIVE
Protein, ur: >300 mg/dL — AB
Urobilinogen, UA: 0.2 mg/dL (ref 0.0–1.0)

## 2010-10-30 LAB — URINE CULTURE
Colony Count: NO GROWTH
Culture: NO GROWTH

## 2010-10-30 LAB — POCT CARDIAC MARKERS
CKMB, poc: <1 ng/mL — ABNORMAL LOW (ref 1.0–8.0)
Troponin i, poc: <0.05 ng/mL (ref 0.00–0.09)

## 2010-10-30 LAB — URINE MICROSCOPIC-ADD ON

## 2010-11-13 LAB — URINALYSIS, ROUTINE W REFLEX MICROSCOPIC
Glucose, UA: NEGATIVE mg/dL
Leukocytes, UA: NEGATIVE
Nitrite: NEGATIVE
Specific Gravity, Urine: 1.014 (ref 1.005–1.030)
pH: 6 (ref 5.0–8.0)

## 2010-11-13 LAB — CBC
HCT: 31.9 % — ABNORMAL LOW (ref 36.0–46.0)
Hemoglobin: 11.1 g/dL — ABNORMAL LOW (ref 12.0–15.0)
Hemoglobin: 11.2 g/dL — ABNORMAL LOW (ref 12.0–15.0)
Hemoglobin: 11.7 g/dL — ABNORMAL LOW (ref 12.0–15.0)
Hemoglobin: 12.3 g/dL (ref 12.0–15.0)
MCHC: 34.7 g/dL (ref 30.0–36.0)
MCHC: 34.7 g/dL (ref 30.0–36.0)
MCHC: 34.8 g/dL (ref 30.0–36.0)
MCV: 94.6 fL (ref 78.0–100.0)
Platelets: 156 10*3/uL (ref 150–400)
RBC: 3.34 MIL/uL — ABNORMAL LOW (ref 3.87–5.11)
RBC: 3.38 MIL/uL — ABNORMAL LOW (ref 3.87–5.11)
RBC: 3.54 MIL/uL — ABNORMAL LOW (ref 3.87–5.11)
RBC: 3.75 MIL/uL — ABNORMAL LOW (ref 3.87–5.11)
RDW: 13.6 % (ref 11.5–15.5)
RDW: 14.1 % (ref 11.5–15.5)
WBC: 8.8 10*3/uL (ref 4.0–10.5)
WBC: 8.9 10*3/uL (ref 4.0–10.5)
WBC: 9.1 10*3/uL (ref 4.0–10.5)

## 2010-11-13 LAB — ETHANOL: Alcohol, Ethyl (B): <5 mg/dL (ref 0–10)

## 2010-11-13 LAB — GLUCOSE, CAPILLARY
Glucose-Capillary: 104 mg/dL — ABNORMAL HIGH (ref 70–99)
Glucose-Capillary: 105 mg/dL — ABNORMAL HIGH (ref 70–99)
Glucose-Capillary: 107 mg/dL — ABNORMAL HIGH (ref 70–99)
Glucose-Capillary: 111 mg/dL — ABNORMAL HIGH (ref 70–99)
Glucose-Capillary: 118 mg/dL — ABNORMAL HIGH (ref 70–99)
Glucose-Capillary: 124 mg/dL — ABNORMAL HIGH (ref 70–99)
Glucose-Capillary: 144 mg/dL — ABNORMAL HIGH (ref 70–99)
Glucose-Capillary: 161 mg/dL — ABNORMAL HIGH (ref 70–99)
Glucose-Capillary: 166 mg/dL — ABNORMAL HIGH (ref 70–99)
Glucose-Capillary: 175 mg/dL — ABNORMAL HIGH (ref 70–99)
Glucose-Capillary: 178 mg/dL — ABNORMAL HIGH (ref 70–99)
Glucose-Capillary: 193 mg/dL — ABNORMAL HIGH (ref 70–99)
Glucose-Capillary: 223 mg/dL — ABNORMAL HIGH (ref 70–99)

## 2010-11-13 LAB — COMPREHENSIVE METABOLIC PANEL
ALT: 12 U/L (ref 0–35)
ALT: 13 U/L (ref 0–35)
ALT: 15 U/L (ref 0–35)
AST: 22 U/L (ref 0–37)
AST: 25 U/L (ref 0–37)
Albumin: 3.2 g/dL — ABNORMAL LOW (ref 3.5–5.2)
Albumin: 3.2 g/dL — ABNORMAL LOW (ref 3.5–5.2)
Alkaline Phosphatase: 59 U/L (ref 39–117)
Alkaline Phosphatase: 59 U/L (ref 39–117)
CO2: 25 mEq/L (ref 19–32)
CO2: 26 mEq/L (ref 19–32)
Calcium: 9.1 mg/dL (ref 8.4–10.5)
Calcium: 9.2 mg/dL (ref 8.4–10.5)
Chloride: 105 mEq/L (ref 96–112)
Chloride: 106 mEq/L (ref 96–112)
GFR calc Af Amer: >60 mL/min (ref >60)
GFR calc non Af Amer: 57 mL/min — ABNORMAL LOW (ref >60)
GFR calc non Af Amer: >60 mL/min (ref >60)
Glucose, Bld: 107 mg/dL — ABNORMAL HIGH (ref 70–99)
Potassium: 3.2 mEq/L — ABNORMAL LOW (ref 3.5–5.1)
Potassium: 3.5 mEq/L (ref 3.5–5.1)
Sodium: 137 mEq/L (ref 135–145)
Sodium: 138 mEq/L (ref 135–145)
Sodium: 139 mEq/L (ref 135–145)
Total Bilirubin: 0.9 mg/dL (ref 0.3–1.2)
Total Protein: 7 g/dL (ref 6.0–8.3)

## 2010-11-13 LAB — BASIC METABOLIC PANEL
CO2: 28 mEq/L (ref 19–32)
Chloride: 104 mEq/L (ref 96–112)
Creatinine, Ser: 0.94 mg/dL (ref 0.4–1.2)
GFR calc Af Amer: >60 mL/min (ref >60)
GFR calc Af Amer: >60 mL/min (ref >60)
GFR calc non Af Amer: 59 mL/min — ABNORMAL LOW (ref >60)
Glucose, Bld: 108 mg/dL — ABNORMAL HIGH (ref 70–99)
Potassium: 3.8 mEq/L (ref 3.5–5.1)
Potassium: 4 mEq/L (ref 3.5–5.1)
Sodium: 134 mEq/L — ABNORMAL LOW (ref 135–145)
Sodium: 143 mEq/L (ref 135–145)

## 2010-11-13 LAB — URINE CULTURE

## 2010-11-13 LAB — TRICYCLICS SCREEN, URINE: TCA Scrn: NOT DETECTED

## 2010-11-13 LAB — DIFFERENTIAL
Eosinophils Absolute: 0.1 10*3/uL (ref 0.0–0.7)
Lymphs Abs: 0.8 10*3/uL (ref 0.7–4.0)
Monocytes Relative: 4 % (ref 3–12)
Neutrophils Relative %: 86 % — ABNORMAL HIGH (ref 43–77)

## 2010-11-13 LAB — URINE MICROSCOPIC-ADD ON

## 2010-11-13 LAB — RAPID URINE DRUG SCREEN, HOSP PERFORMED: Barbiturates: NOT DETECTED

## 2010-11-13 LAB — POCT CARDIAC MARKERS
Myoglobin, poc: 107 ng/mL (ref 12–200)
Troponin i, poc: <0.05 ng/mL (ref 0.00–0.09)

## 2010-11-19 LAB — GLUCOSE, CAPILLARY: Glucose-Capillary: 156 mg/dL — ABNORMAL HIGH (ref 70–99)

## 2010-11-19 LAB — BASIC METABOLIC PANEL
BUN: 20 mg/dL (ref 6–23)
Calcium: 9.6 mg/dL (ref 8.4–10.5)
Creatinine, Ser: 0.81 mg/dL (ref 0.4–1.2)
GFR calc Af Amer: >60 mL/min (ref >60)
GFR calc non Af Amer: >60 mL/min (ref >60)

## 2010-11-19 LAB — PROTIME-INR
INR: 1 (ref 0.00–1.49)
Prothrombin Time: 13.6 seconds (ref 11.6–15.2)

## 2010-11-19 LAB — CBC
MCV: 93.2 fL (ref 78.0–100.0)
Platelets: 272 10*3/uL (ref 150–400)
RBC: 3.69 MIL/uL — ABNORMAL LOW (ref 3.87–5.11)
WBC: 11.1 10*3/uL — ABNORMAL HIGH (ref 4.0–10.5)

## 2010-11-19 LAB — APTT: aPTT: 38 seconds — ABNORMAL HIGH (ref 24–37)

## 2010-11-20 LAB — CROSSMATCH: ABO/RH(D): O POS

## 2010-11-20 LAB — COMPREHENSIVE METABOLIC PANEL
ALT: 12 U/L (ref 0–35)
ALT: 21 U/L (ref 0–35)
ALT: 24 U/L (ref 0–35)
AST: 31 U/L (ref 0–37)
AST: 33 U/L (ref 0–37)
Albumin: 2.7 g/dL — ABNORMAL LOW (ref 3.5–5.2)
Alkaline Phosphatase: 60 U/L (ref 39–117)
BUN: 19 mg/dL (ref 6–23)
BUN: 59 mg/dL — ABNORMAL HIGH (ref 6–23)
CO2: 25 mEq/L (ref 19–32)
CO2: 25 mEq/L (ref 19–32)
CO2: 27 mEq/L (ref 19–32)
Calcium: 8.6 mg/dL (ref 8.4–10.5)
Calcium: 8.7 mg/dL (ref 8.4–10.5)
Chloride: 100 mEq/L (ref 96–112)
Chloride: 100 mEq/L (ref 96–112)
Creatinine, Ser: 0.89 mg/dL (ref 0.4–1.2)
Creatinine, Ser: 1.09 mg/dL (ref 0.4–1.2)
Creatinine, Ser: 1.28 mg/dL — ABNORMAL HIGH (ref 0.4–1.2)
GFR calc Af Amer: 49 mL/min — ABNORMAL LOW (ref >60)
GFR calc Af Amer: >60 mL/min (ref >60)
GFR calc non Af Amer: 41 mL/min — ABNORMAL LOW (ref >60)
GFR calc non Af Amer: >60 mL/min (ref >60)
GFR calc non Af Amer: >60 mL/min (ref >60)
Glucose, Bld: 155 mg/dL — ABNORMAL HIGH (ref 70–99)
Glucose, Bld: 170 mg/dL — ABNORMAL HIGH (ref 70–99)
Glucose, Bld: 89 mg/dL (ref 70–99)
Potassium: 3.8 mEq/L (ref 3.5–5.1)
Sodium: 135 mEq/L (ref 135–145)
Sodium: 136 mEq/L (ref 135–145)
Total Bilirubin: 1.1 mg/dL (ref 0.3–1.2)
Total Bilirubin: 1.2 mg/dL (ref 0.3–1.2)
Total Protein: 6 g/dL (ref 6.0–8.3)

## 2010-11-20 LAB — RAPID URINE DRUG SCREEN, HOSP PERFORMED
Barbiturates: NOT DETECTED
Benzodiazepines: NOT DETECTED

## 2010-11-20 LAB — URINE MICROSCOPIC-ADD ON

## 2010-11-20 LAB — CBC
HCT: 21.3 % — ABNORMAL LOW (ref 36.0–46.0)
HCT: 28.7 % — ABNORMAL LOW (ref 36.0–46.0)
HCT: 29.2 % — ABNORMAL LOW (ref 36.0–46.0)
HCT: 30.5 % — ABNORMAL LOW (ref 36.0–46.0)
HCT: 20.8 % — ABNORMAL LOW (ref 36.0–46.0)
HCT: 27 % — ABNORMAL LOW (ref 36.0–46.0)
HCT: 30.9 % — ABNORMAL LOW (ref 36.0–46.0)
HCT: 38.2 % (ref 36.0–46.0)
Hemoglobin: 10.4 g/dL — ABNORMAL LOW (ref 12.0–15.0)
Hemoglobin: 13.2 g/dL (ref 12.0–15.0)
Hemoglobin: 13.3 g/dL (ref 12.0–15.0)
Hemoglobin: 13.4 g/dL (ref 12.0–15.0)
Hemoglobin: 8.9 g/dL — ABNORMAL LOW (ref 12.0–15.0)
MCHC: 34.6 g/dL (ref 30.0–36.0)
MCHC: 34.3 g/dL (ref 30.0–36.0)
MCHC: 34.6 g/dL (ref 30.0–36.0)
MCHC: 34.8 g/dL (ref 30.0–36.0)
MCHC: 35 g/dL (ref 30.0–36.0)
MCV: 93.8 fL (ref 78.0–100.0)
MCV: 94.6 fL (ref 78.0–100.0)
MCV: 94.8 fL (ref 78.0–100.0)
MCV: 94 fL (ref 78.0–100.0)
MCV: 94.2 fL (ref 78.0–100.0)
MCV: 94.8 fL (ref 78.0–100.0)
MCV: 95.3 fL (ref 78.0–100.0)
MCV: 95.4 fL (ref 78.0–100.0)
MCV: 95.4 fL (ref 78.0–100.0)
MCV: 95.6 fL (ref 78.0–100.0)
MCV: 96.2 fL (ref 78.0–100.0)
Platelets: 258 10*3/uL (ref 150–400)
Platelets: 292 10*3/uL (ref 150–400)
Platelets: 312 10*3/uL (ref 150–400)
Platelets: 155 10*3/uL (ref 150–400)
Platelets: 166 10*3/uL (ref 150–400)
Platelets: 183 10*3/uL (ref 150–400)
Platelets: 191 10*3/uL (ref 150–400)
Platelets: 281 10*3/uL (ref 150–400)
RBC: 3.06 MIL/uL — ABNORMAL LOW (ref 3.87–5.11)
RBC: 2.17 MIL/uL — ABNORMAL LOW (ref 3.87–5.11)
RBC: 2.71 MIL/uL — ABNORMAL LOW (ref 3.87–5.11)
RBC: 4.02 MIL/uL (ref 3.87–5.11)
RBC: 4.05 MIL/uL (ref 3.87–5.11)
RBC: 4.06 MIL/uL (ref 3.87–5.11)
RDW: 16.9 % — ABNORMAL HIGH (ref 11.5–15.5)
RDW: 17 % — ABNORMAL HIGH (ref 11.5–15.5)
RDW: 16 % — ABNORMAL HIGH (ref 11.5–15.5)
RDW: 16.7 % — ABNORMAL HIGH (ref 11.5–15.5)
WBC: 12.3 10*3/uL — ABNORMAL HIGH (ref 4.0–10.5)
WBC: 12.9 10*3/uL — ABNORMAL HIGH (ref 4.0–10.5)
WBC: 9.5 10*3/uL (ref 4.0–10.5)
WBC: 10.8 10*3/uL — ABNORMAL HIGH (ref 4.0–10.5)
WBC: 16 10*3/uL — ABNORMAL HIGH (ref 4.0–10.5)
WBC: 16 10*3/uL — ABNORMAL HIGH (ref 4.0–10.5)
WBC: 16.4 10*3/uL — ABNORMAL HIGH (ref 4.0–10.5)
WBC: 18 10*3/uL — ABNORMAL HIGH (ref 4.0–10.5)
WBC: 18 10*3/uL — ABNORMAL HIGH (ref 4.0–10.5)

## 2010-11-20 LAB — GLUCOSE, CAPILLARY
Glucose-Capillary: 114 mg/dL — ABNORMAL HIGH (ref 70–99)
Glucose-Capillary: 115 mg/dL — ABNORMAL HIGH (ref 70–99)
Glucose-Capillary: 116 mg/dL — ABNORMAL HIGH (ref 70–99)
Glucose-Capillary: 118 mg/dL — ABNORMAL HIGH (ref 70–99)
Glucose-Capillary: 119 mg/dL — ABNORMAL HIGH (ref 70–99)
Glucose-Capillary: 122 mg/dL — ABNORMAL HIGH (ref 70–99)
Glucose-Capillary: 123 mg/dL — ABNORMAL HIGH (ref 70–99)
Glucose-Capillary: 126 mg/dL — ABNORMAL HIGH (ref 70–99)
Glucose-Capillary: 128 mg/dL — ABNORMAL HIGH (ref 70–99)
Glucose-Capillary: 151 mg/dL — ABNORMAL HIGH (ref 70–99)
Glucose-Capillary: 174 mg/dL — ABNORMAL HIGH (ref 70–99)
Glucose-Capillary: 86 mg/dL (ref 70–99)
Glucose-Capillary: 93 mg/dL (ref 70–99)
Glucose-Capillary: 94 mg/dL (ref 70–99)
Glucose-Capillary: 114 mg/dL — ABNORMAL HIGH (ref 70–99)
Glucose-Capillary: 128 mg/dL — ABNORMAL HIGH (ref 70–99)
Glucose-Capillary: 132 mg/dL — ABNORMAL HIGH (ref 70–99)
Glucose-Capillary: 136 mg/dL — ABNORMAL HIGH (ref 70–99)
Glucose-Capillary: 143 mg/dL — ABNORMAL HIGH (ref 70–99)
Glucose-Capillary: 146 mg/dL — ABNORMAL HIGH (ref 70–99)
Glucose-Capillary: 150 mg/dL — ABNORMAL HIGH (ref 70–99)
Glucose-Capillary: 155 mg/dL — ABNORMAL HIGH (ref 70–99)
Glucose-Capillary: 157 mg/dL — ABNORMAL HIGH (ref 70–99)
Glucose-Capillary: 158 mg/dL — ABNORMAL HIGH (ref 70–99)
Glucose-Capillary: 158 mg/dL — ABNORMAL HIGH (ref 70–99)
Glucose-Capillary: 179 mg/dL — ABNORMAL HIGH (ref 70–99)
Glucose-Capillary: 187 mg/dL — ABNORMAL HIGH (ref 70–99)
Glucose-Capillary: 191 mg/dL — ABNORMAL HIGH (ref 70–99)
Glucose-Capillary: 195 mg/dL — ABNORMAL HIGH (ref 70–99)
Glucose-Capillary: 197 mg/dL — ABNORMAL HIGH (ref 70–99)
Glucose-Capillary: 217 mg/dL — ABNORMAL HIGH (ref 70–99)
Glucose-Capillary: 228 mg/dL — ABNORMAL HIGH (ref 70–99)

## 2010-11-20 LAB — URINALYSIS, ROUTINE W REFLEX MICROSCOPIC
Bilirubin Urine: NEGATIVE
Ketones, ur: NEGATIVE mg/dL
Ketones, ur: NEGATIVE mg/dL
Nitrite: NEGATIVE
Nitrite: POSITIVE — AB
Protein, ur: 30 mg/dL — AB
Protein, ur: >300 mg/dL — AB
Urobilinogen, UA: 0.2 mg/dL (ref 0.0–1.0)
Urobilinogen, UA: 0.2 mg/dL (ref 0.0–1.0)

## 2010-11-20 LAB — TSH
TSH: 0.852 u[IU]/mL (ref 0.350–4.500)
TSH: 1.257 u[IU]/mL (ref 0.350–4.500)

## 2010-11-20 LAB — URINE CULTURE
Colony Count: 45000
Colony Count: >=100000

## 2010-11-20 LAB — BASIC METABOLIC PANEL
Chloride: 101 mEq/L (ref 96–112)
Chloride: 92 mEq/L — ABNORMAL LOW (ref 96–112)
GFR calc Af Amer: >60 mL/min (ref >60)
GFR calc non Af Amer: 52 mL/min — ABNORMAL LOW (ref >60)
GFR calc non Af Amer: 59 mL/min — ABNORMAL LOW (ref >60)
Potassium: 3.9 mEq/L (ref 3.5–5.1)
Potassium: 3.3 mEq/L — ABNORMAL LOW (ref 3.5–5.1)
Sodium: 128 mEq/L — ABNORMAL LOW (ref 135–145)

## 2010-11-20 LAB — DIFFERENTIAL
Basophils Absolute: 0 10*3/uL (ref 0.0–0.1)
Basophils Relative: 1 % (ref 0–1)
Eosinophils Absolute: 0.1 10*3/uL (ref 0.0–0.7)
Eosinophils Absolute: 0.4 10*3/uL (ref 0.0–0.7)
Eosinophils Relative: 0 % (ref 0–5)
Lymphs Abs: 2.2 10*3/uL (ref 0.7–4.0)
Monocytes Absolute: 1.2 10*3/uL — ABNORMAL HIGH (ref 0.1–1.0)
Monocytes Relative: 11 % (ref 3–12)
Neutrophils Relative %: 64 % (ref 43–77)
Neutrophils Relative %: 85 % — ABNORMAL HIGH (ref 43–77)

## 2010-11-20 LAB — POCT CARDIAC MARKERS: Myoglobin, poc: 147 ng/mL (ref 12–200)

## 2010-11-20 LAB — ANTI-NUCLEAR AB-TITER (ANA TITER): ANA Titer 1: 1:40 {titer} — ABNORMAL HIGH

## 2010-11-20 LAB — ETHANOL: Alcohol, Ethyl (B): <5 mg/dL (ref 0–10)

## 2010-11-20 LAB — HEMOCCULT GUIAC POC 1CARD (OFFICE)
Fecal Occult Bld: POSITIVE
Fecal Occult Bld: POSITIVE

## 2010-11-20 LAB — DIGOXIN LEVEL: Digoxin Level: <0.2 ng/mL — ABNORMAL LOW (ref 0.8–2.0)

## 2010-11-20 LAB — HEMOGLOBIN AND HEMATOCRIT, BLOOD: Hemoglobin: 10.3 g/dL — ABNORMAL LOW (ref 12.0–15.0)

## 2010-11-20 LAB — LIPID PANEL
LDL Cholesterol: 153 mg/dL — ABNORMAL HIGH (ref 0–99)
Total CHOL/HDL Ratio: 5.4 RATIO
VLDL: 35 mg/dL (ref 0–40)

## 2010-11-20 LAB — KETONES, QUALITATIVE: Acetone, Bld: NEGATIVE

## 2010-11-20 LAB — ANA: Anti Nuclear Antibody(ANA): POSITIVE — AB

## 2010-11-20 LAB — SEDIMENTATION RATE: Sed Rate: 31 mm/hr — ABNORMAL HIGH (ref 0–22)

## 2010-12-24 NOTE — Discharge Summary (Signed)
Jenna, [name]                ACCOUNT NO.:  [id number]   MEDICAL RECORD NO.:  [id number]          PATIENT TYPE:  INP   LOCATION:  5730                         FACILITY:  [facility]   PHYSICIAN:  Gwen Pounds, MD       DATE OF BIRTH:  Aug 07, 1931   DATE OF ADMISSION:  03/12/2007  DATE OF DISCHARGE:  03/16/2007                               DISCHARGE SUMMARY   GI PHYSICIAN:  Cassell Clement, M.D.   DISCHARGE DIAGNOSES:  1. Continued diarrhea.  2. Lymphocytic colitis.  3. Nausea.  4. Azotemia.  5. Systemic lupus erythematosus.  6. Diabetes mellitus type 2.  7. Anemia, status post transfusion.  8. Leukocytosis, chronic.  9. Failure to thrive and deconditioning.   For a full list of medical diagnoses, please see the prior discharge  summaries and H&Ps.   DISCHARGE MEDICATIONS:  1. Prednisone 7.5 mg p.o. daily.  To wean to the dose that Dr. Lacretia Nicks. Danella Maiers wants her, which is 5 mg daily.  2. Zetia 10 mg daily.  3. Calcium and vitamin D daily.  4. Lanoxin 0.125 mg p.o. daily.  5. Synthroid 125 mcg p.o. daily.  6. Neurontin 400 mg p.o. three times daily.  7. Vitamin D 50,000 units weekly.  8. Januvia 100 mg p.o. daily.  9. Starlix 60 mg twice daily.  10.Flagyl 500 mg p.o. three times daily.  11.Protonix 40 mg p.o. daily.  12.Flora-Q one p.o. daily.  13.Questran 4 grams in 8 ounces of water twice daily.  14.She is to stay off of Tekturna x1 week, then restart it at 150 mg      daily.  15.To hold the Lasix until her weight picks up or until she starts to      get edema.  When she does start it, if she is going to use it, I      want her to use it p.r.n. on a two-to-three-day-per-week schedule.   HOSPITAL PROCEDURES:  Medical management and GI consultation.   HISTORY OF PRESENT ILLNESS:  Briefly, this is a 77 year old female here  for continued symptoms of weakness, failure to thrive, shaking, nausea,  diarrhea and dehydration.  She saw me on March 10, 2007.  Labs came back  after the visit which revealed her BUN had gone up to 63 and creatinine  2.1, potassium 6.2 and calcium 11.2.  She was told to hold her Lasix and  increase fluid and repeat the labs on seeing her in the office on March 12, 2007.  It was clear she was unable to return home, and she was  admitted for further evaluation and treatment.   HOSPITAL COURSE:  [name] is a 77 year old female with  significant and prolonged lymphocytic colitis, despite multiple attempts  by Dr. Molly Maduro Buccini to get her under control, and despite her taking  Flagyl on a three-times-per-day basis for several months.  She never got  complete relief and occasionally it comes back in a pretty severe form.  On March 12, 2007, she was admitted with failure to thrive weakness,  azotemia secondary to dehydration from continued and progressive  worsening of her underlying diarrhea.  She was seen by GI that day, by  Dr. Graylin Shiver, who made several notations including that this is a  77 year old female with a 32-pound weight loss and failure to thrive, an  abnormal amount of bowel movements, on Flagyl for the last seven or  eight months, despite the fact that she has had significant diarrhea for  well over one year.  He thought that we should go ahead and Guaiac her  stools and check stool cultures.  He agreed with my starting her on  Questran and he went ahead and started her on probiotics.  Other  recommendations from him were that he reported that she has a well-known  lymphocytic colitis.  This also could be diabetic diarrhea.  He went  back over her history and saw that every time the Clostridium difficile  was checked it was negative.  He also wondered whether there is a  underlying bacterial overgrowth.  He recommended at this point just to  continue following.   On March 13, 2007, she was seen in followup.  Her labs were improving.  Her BUN and creatinine were down to 36 and 1.5 respectively.  Her  white  count had decreased from 16.7 to 14.2.  Her hemoglobin remained stable  at 9.  Her sedimentation rate was 110.   Her chest x-ray revealed cardiomegaly without congestive heart failure.  KUB showed non-obstructive bile gas pattern.   PHYSICAL EXAMINATION:  Was unchanged.   The Cholestyramine and Flagyl with slightly increased dose of the  steroids and the probiotics seemed to be working a little bit,  especially with her underlying fluids that were given.  She was seen  again on March 14, 2007.  Her diarrhea was markedly improved at this  point.  We started talking about when she could go home.  Dr. Evette Cristal felt  that no inpatient procedures were necessary.  She was restarted on some  of her home medications to try to help with her blood pressure and  diabetes.  A blood culture was called on March 14, 2007.  It had gram-  positive cocci in clusters.  This eventually grew out coagulase-negative  Staphylococcus.  Considering the other culture was negative, this was  believed to be an underlying contaminate.  Later on March 15, 2007, she  was seen again and she was ultimately in between a formed stool and a  little bit of diarrhea.  She was having some nausea.  We were advancing  her diet.  Her BUN and creatinine were back to normal.  All the rest of  her labs were fine.   The physical examination was fine.  Her blood sugars were fine.  Therefore her diarrhea was felt to be better than on admission.  All the  rest of her cultures were being followed and reviewed.  Nothing was  positive.  Therefore, when she goes home she is going to be discharged  on Flora-Q and Questran, as well as her ongoing Flagyl.  Her nausea was  felt to be secondary to Vicodin.  Reglan was added for a little bit,  potentially this was diabetic and not a GI issue.  An ultrasound was  checked to rule out stones.  She had some stones but no evidence of  cholecystitis.  Her medication list was finalized.  She was  kept one  more day.  She saw physical therapy  and occupational therapy who gave  their recommendations.  One of the recommendations was maybe knee-high  TED hose may be useful to keep the lower extremity edema down.   DISPOSITION:  She was seen by my partner on March 16, 2007.  Her  diarrhea was better.  Her nausea was better.  Her blood pressure and  diabetes were stable, and it was felt that she could be discharged home  safely with appropriate outpatient followup.   ADDENDUM:  For her anemia, she did receive some transfusion while in the  hospital.      Gwen Pounds, MD  Electronically Signed     JMR/MEDQ  D:  04/13/2007  T:  04/13/2007  Job:  [id number]   cc:   Cassell Clement, M.D.  Bernette Redbird, M.D.  Graylin Shiver, M.D.

## 2010-12-24 NOTE — Consult Note (Signed)
NAMECALEA, HRIBAR                ACCOUNT NO.:  [id number]   MEDICAL RECORD NO.:  [id number]          PATIENT TYPE:  INP   LOCATION:  3114                         FACILITY:  [facility]   PHYSICIAN:  Melvyn Novas, M.D.  DATE OF BIRTH:  1931/03/18   DATE OF CONSULTATION:  11/14/2008  DATE OF DISCHARGE:                                 CONSULTATION   Ms. Minetti is a patient of Dr. Timothy Lasso.  A 77 year old Caucasian female who  presented today with a complaint of back pain after a fall at home.  The  fall was not witnessed and a son of the patient who is present in the  room states that he does not know if his mother hit her head or not.  The fall happened around 4:30 p.m.  By 5 o'clock, the mother was here in  the ER and the son states that since then her mental status has  significantly changed that she is talking out of her head, and he had  also stated that her blood pressure upon arrival was in normal range,  but was highly elevated at 170 systolic within an hour after her arrival  here.  A CT scan of the head unenhanced was obtained and showed no  hemorrhagic or traumatic injuries a stroke was also not seen to Dr.  Clelia Croft, the physician on-call today for [facility] asked  me to evaluate the patient.  He is concerned about her mental status  changes, but also stated that about 4 years ago, the patient had a  similar episode which resolved spontaneously.   The patient herself is unable to give me a history of present illness or  the past medical history with chronological sequence.  She is not  answering the questions appropriately, she is perseverating, I asked her  for example, where she hurts, and she repeatedly stated, I am okay  now.  I think I am better now.  She does not state why she came to the  ER or where her chief complaint lies now.  I learned from her son that  she has so far not been eating or drinking since being in the ER for  almost 5 hours now, and I have  requested a stroke swallow screen to be  done at the bedside.  Whenever the patient reaches the peripheral floor.  The altered mental status has not improved, but rather progressed since  her arrival in the ER.   The patient has a past medical history of hypertension which has at that  time been uncontrolled and led previously to headaches as well as  confusion.  She is speaking an incomprehensible sentences, but does  shows nostril dysarthria.  She is able to follow commands intermittently  and only single-step.  She is not oriented to time, place, location.  She denies being in pain, yet she lies curled up on the side in a fetal  position while she states so, and her body language would be more likely  to be resumed by a patient in pain.  Again, a CT of the head  showed no  intracranial abnormalities.  She also had an x-ray of the right shoulder  which showed only degenerative changes and calcified tendinopathy and a  lumbar spine view was also obtained showing degenerative disk disease at  L5-S1 and to a lesser degree L4-L5.  There is age appropriate expected  loss of vertebral height.   The patient's laboratory show urinary proteinuria, but urine is clear  and leuk esterase negative, glucose serum level is 146.  Again, the  patient was not fasting at the time.  She had a white blood cell count  of 18,000, hemoglobin and hematocrit of 13.3/38.7, platelet count of  200,000.  Her RDW is elevated at 17%, neutrophilia at 85%,  granulocytosis at 15.3 is very impressive and would speak for bacterial  infection.  No eosinophilia, mild monocytophilia at 1.4.  Sodium of 535,  potassium 3.5, carbon dioxide 27, glucose of 155, BUN 17, creatinine  0.89, total protein is 8, AST 33, ALT 34, both on normal limits.  Salicylate and acetaminophen were not found in her serum.  Her troponin  was less than 0.05.  CK-MB was 1.9.  Myoglobin 147.  Blood pressure was  197/113 at 7:30 p.m., heart rate 118,  respiratory rate 18, so the  patient definitely shows hypertension which could affect her mental  status and tachycardia.  Plus she seems to have an infection likely of  bacterial origin.  Tox screen was otherwise negative.  She has not yet  had results of her digoxin level pack.   CURRENT MEDICATIONS:  Lasix 40 mg once a day, vitamin D 50,000 units  once a week, Lanoxin and Zetia are listed, but no doses are given,  prednisone 5 mg, Januvia, aspirin 81 mg daily, potassium chloride 10 mEq  once a day.  Again, blood pressure on arrival was 131 and has since then  been 196 systolic and 189 systolic.   PAST MEDICAL HISTORY:  After review of her medical records shows that  she has a history of GERD, colitis, diabetes, diabetic neuropathy, non-  insulin-dependent diabetes, fibromyalgia, gastritis, gout,  hyperlipidemia, hypertension, hyperthyroidism, history of lupus, and  pericarditis.   SOCIAL HISTORY:  The patient lives at home, nonsmoker, nondrinker,  nondrug abuser, adult children.  I do not know if she is widowed,  married, or if she lives alone at home.   PHYSICAL EXAMINATION:  SKIN:  No rash and shows no bruising.  EXTREMITIES:  Warm, but she does spontaneously move feet or lower  extremities at all.  She remains rolled onto her right side at a fetal  position.  I asked the patient to resume a supine position and she seems  to be having more lower back pain as we rebedded her.  She was able to extend all 4 extremities.  She was not able to follow  simple motor commands such as pushing and dorsiflex and plantarflex  either foot, but spontaneously she could do these movements on the left.  She did them to a much lesser degree on the right lower extremity.  She has bilaterally however, good grip strength.  She seems to protect  her shoulder during her passive range of motion testing, but does not  show grimacing that I would relate to pain.  A straight leg raising test  on the right  caused her to have pain that she cannot indicate where.  The tendon reflexes are attenuated throughout and are not asymmetric.  The patient has a sensory aphasia and basically  is talking nonsense.  She does not have dysarthria.  She was not yet tested for dysphagia.  Pupils react, equal to light.  She can follow with both eyes conjugate  moving object.  She has no ptosis.  No facial asymmetry.  Tongue and  uvula are midline.  There is no evidence of tongue bite.  Shoulder shrug  is reduced on the right and the patient seems to protect herself from  pain.  Gait was deferred.  Finger-to-nose coordination could not be  performed as the patient cannot follow instructions.  Sensory-to-primary  modalities cannot be tested as the patient cannot response and  differentiate left versus right correctly at this time.   ASSESSMENT:  This could be a simple hypertensive encephalopathy, but I  do think that she has some left brain dominant deficits in this exam  given her extremity weakness which may be pain and fall-related and her  speech specific changes of this aphasia in a right-handed individual.  I  saw Dr. Alver Fisher orders which include an MRI of the brain and I would  fully agree to do so.  I would like her hypertension to be reduced to a  systolic pressure between 140 and 160, but not found to be low.  She  should have neuro checks and I agree with that.  She is n.p.o. pending a  swallow evaluation.  We will obtain a digoxin level, this was ordered already by the ER  staff, but the results have not yet been made available.  PT, OT, ST  evaluation and possible rehab evaluation.  I added an EEG to the  patient's orders.  I would like to add that the ER physicians have reported that the  patient was nauseated, but I could not elicit nausea, however, a brain  stem event or cerebellar event may not show up on a CT scan and  therefore, the MRI of the brain would still be a very important part of  the  further workup.  Given her history of lupus, we may have to evaluate her for  hypercoagulability and a 2-D echo should be obtained should the MRI show  abnormalities.   I thank Dr. Clelia Croft for the consultation.  As I will be a stroke physician  for the neurology team on Wednesday November 15, 2008, I will be available  for further questions under the pager number [phone].     Melvyn Novas, M.D.  Electronically Signed    CD/MEDQ  D:  11/14/2008  T:  11/15/2008  Job:  [id number]

## 2010-12-24 NOTE — Consult Note (Signed)
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  She has also had increasing swelling in the left  leg compared to the right leg particularly in the ankle and foot.  She  has had no previous history of deep venous thrombosis, thrombophlebitis,  bleeding or stasis ulcers but has had some darkening of the skin in the  left ankle over the last few years.  She has not been able to wear  elastic stockings in the past because of discomfort.  She does take a  lot of pain medication for her back and elevates her legs on occasion.  She has never had the veins evaluated in the past.   PAST MEDICAL HISTORY:  1. Diabetes mellitus type 2.  2. Hypertension.  3. History of the lumbar spine fracture.  4. Negative for coronary artery disease, COPD or stroke.   PAST SURGICAL HISTORY:  1. Perirectal abscess drainage.  2. Cholecystectomy.  3. History of hysterectomy.   FAMILY HISTORY:  Positive for coronary artery disease in her mother and  father, diabetes in her mother.  Negative for stroke.   SOCIAL HISTORY:  She is widowed, has 4 children.  Does not use tobacco  or alcohol.   REVIEW OF SYSTEMS:  Positive for weight loss, chest pain, dyspnea on  exertion, heart murmur, history of reflux esophagitis, constipation,  urinary frequency, occasional dizziness, joint pain, and muscle pain.   ALLERGIES:  Lasix, Lipitor and spironolactone.   MEDICATIONS:  Please see health history exam.   PHYSICAL EXAMINATION:  Blood pressure 193/100, heart rate 92,  respirations 14.  General:  She is an elderly female in no apparent  distress.  Does have a brace she is wearing for her back problem and is  in a wheelchair.  She is alert and oriented  x3.  Neck:  Supple, 3+  carotid pulses are palpable.  No bruits are audible.  Neurologic exam:  Normal.  Chest:  Clear to auscultation.  Abdomen:  Soft, nontender with  no masses.  She has 3+ femoral and dorsalis pedis pulses bilaterally.  Left leg has severe venous insufficiency with 1 to 2+ edema and multiple  varicosities over the distal thigh and medial calf.  She also has a lot  of spider and reticular veins extending down to the lower third of the  leg around the ankle area but no active ulcerations.  Right leg is free  of varicosities.   Venous duplex exam today reveals gross reflux in the left great  saphenous vein from the knee to the proximal thigh with no reflux on the  left. small saphenous vein and normal deep system.   I think she is having pain and swelling in the left leg due to her  refluxing great saphenous system.  We will treat her with long-leg  elastic compression stockings (20 mm - 30 mm gradient) as well as  elevation and continue pain medication.  She will return in 3 months. If  there has been no improvement, I think she should be treated with laser  ablation to  the left great saphenous vein with multiple stab  phlebectomies as 1 procedure.   Quita Skye Hart [place], M.D.  Electronically Signed   JDL/MEDQ  D:  03/06/2009  T:  03/07/2009  Job:  [id number]   cc:   Gwen Pounds, MD

## 2010-12-24 NOTE — H&P (Signed)
NAMECANDIA, KINGSBURY NO.:  [id number]   MEDICAL RECORD NO.:  [id number]          PATIENT TYPE:  INP   LOCATION:  5715                         FACILITY:  [facility]   PHYSICIAN:  Gwen Pounds, MD       DATE OF BIRTH:  Dec 04, 1930   DATE OF ADMISSION:  04/06/2007  DATE OF DISCHARGE:                              HISTORY & PHYSICAL   PRIMARY CARE Ellijah Leffel:  Dr. Creola Corn   GASTROENTEROLOGIST:  Dr. Nadine Counts Buccini   CHIEF COMPLAINT:  Nausea, decreased appetite, loss of weight, anorexia,  dehydration, weakness and failure to thrive.   HISTORY OF PRESENT ILLNESS:  This is a 77 year old female who was in the  hospital earlier this month with continued diarrheal-related issues.  During that hospital stay, she had dehydration, azotemia and was pretty  ill.  Myself and Dr. Evette Cristal from GI helped manage her.  Fortunately, all  she needed was appropriate medical management including IV fluids and  continuation of the Flagyl that she has been on for continued colitis,  as well as the addition of Flora-Q and cholestyramine.  This seemed to  help dramatically and lowered the amount of stool output that she had.  Her creatinine got as high as 2.1.  She followed up with me on March 25, 2007 and she was doing much better.  She was having more formed  bowel movements, much less diarrhea; she felt stronger, but she was  worried about the amount of fluid that she was developing in her ankles  and we were able to restart some of her Lasix at low dose.  Her weight  was 133 pounds, her blood pressures were appropriate, and generally she  looked the best I have seen her in a while.  We went ahead and did labs;  the laboratory data showed that her BUN was 14, creatinine was 1.0,  white count was 17.4 and she has chronically elevated white count with  levels anywhere from 15 to 22, her hemoglobin was steady and that was  even after getting transfusion in the hospital, her hemoglobin actually  was 12.5, and a urinalysis was done which showed moderate leukocytes, 5-  10 white cells, no bacteria and a urine culture was negative.  Her son,  Chanetta Marshall, and the patient reports on August 26 that she actually did well  after that last visit, and unfortunately we had to take her off of her  Starlix and Januvia and she remains off Tekturna and we had to start her  on Lantus SoloSTAR, for her diabetes.  This has been working out okay.  They report that she started getting ill on Friday, worse since Sunday  with significant nausea, no vomiting, no diarrhea, but has not eaten or  drank much since then.  This morning, she had half a toast and some  coffee; she is able to keep the fluids down, but she does not feel like  consuming anymore.  Again, her CBGs and blood pressure have been good;  she is down to two Flagyl a day; she denies any burning, any  pain; she  has held the Lasix the last couple of days; she had not had any  abdominal pain, no reflux and no blood noted, and she does report that  she is having significant amount of right leg and foot hurting.  She is  wondering if this is gout.  She is in the room with me this morning and  she is extremely weak; her mouth is very dry; she appears dehydrated and  ill.  She will be admitted for further evaluation and treatment.   PAST MEDICAL HISTORY:  1. Type 2 diabetes.  2. Hypothyroidism.  3. Hyperlipidemia.  4. Diabetes neuropathy.  5. Fibromyalgia.  6. History of pericarditis.  7. Systemic lupus and potentially a nondescript autoimmune syndrome.  8. History of pancreatitis.  9. History of microalbuminuria.  10.Gout.  11.Lymphocytic colitis.  12.Osteoporosis.  13.History of inferior ramus stress fracture January of 2006.  14.History of gastritis.  15.Left and right cataract surgeries.  16.EF 50 to 60% status post bladder tack operation.  17.Polypectomy in January of 2003.  18.Peripheral field defect.   ALLERGIES:  INCLUDE ALTACE,  HYZAAR, ACTOS, FOSAMAX, DIOVAN AND BONIVA.   MEDICATION LIST:  Includes:  1. Flagyl 500 b.i.d.  2. Protonix 40 q.day.  3. Lasix 20 mg q.day as needed.  4. Vitamin D 50,000 units per week.  5. Neurontin 400 t.i.d.  6. Synthroid 112 mcg daily.  7. Lanoxin 0.125 mg daily.  8. Calcium plus D.  9. Zetia 10 q.day.  10.Prednisone 7.5 mg a day.  11.Questran 4 gm with 8 ounces of water b.i.d.  12.Flora-Q one tablet q.daily.  13.Lantus 10 units at bedtime.   SOCIAL HISTORY:  She is widowed; she has four children; she is retired,  no alcohol or tobacco.   FAMILY HISTORY:  Stroke, congestive heart failure and diabetes.   REVIEW OF SYSTEMS:  Positive for what is in the HPI.  She denies any  unusual exposures.  She has a dry mouth.  She denies any fevers or  headaches.  She denies any chest pain or shortness of breath.  She  denies any genitourinary symptoms.  She is weak and nauseated, no  vomiting.  All other systems reviewed negative.   GENERAL:  Alert and oriented x3, very dry mouth, ill appearing, weak and  tired.  Oropharynx otherwise is unremarkable.  NECK:  Without lymphadenopathy or JVD.  CARDIOVASCULAR:  Regular without murmurs.  PULMONARY:  Clear to auscultation bilaterally.  ABDOMEN:  Soft, nontender.  No rebound or guarding.  No  hepatosplenomegaly.  Increased bowel sounds noted.  EXTREMITIES:  No clubbing.  No cyanosis.  Minimal edema.  NEUROLOGIC:  Alert and oriented x3.   ASSESSMENT:  This is an elderly female with multiple GI issues over time  being admitted with nausea, anorexia, dehydration, weakness and failure  to thrive.   PLAN:  1. Will admit.  2. Check labs.  3. KUB.  4. IV fluids.  5. Antiemetics as needed.  6. I presume this is not part of her recent colitis issues and more      related to a viral gastroenteritis and dehydration; hopefully will      resolve in 24 to 48 hours.  7. Conservative treatment.  8. Foot pain in a patient with a negative exam.   Will check a uric      acid and follow accordingly.  9. Diabetes.  Will continue Lantus with sliding-scale insulin.  She      will stay off  her Starlix and Januvia.  10.Hypertension.  Her blood pressure is fine even off the Tekturna.  11.Anemia.  Will follow up on her hemoglobin status post transfusion;      last hospitalization hemoglobin appropriate on August 14.  12.Diarrhea.  Fortunately continues to be under control, but now the      nausea is the biggest issue.  Will add the cholestyramine, the      Flagyl and the Flora-Q back over the next couple of days.  Maybe      the Flagyl is part of the reason why she is nauseated.  13.During her last hospital stay, she had an abdominal ultrasound      which revealed chololithiasis without acute cholecystitis; this      will always be kept in the back of the mind as a small possibility      of her underlying nausea.  Will work this up as needed.      Gwen Pounds, MD  Electronically Signed     JMR/MEDQ  D:  04/06/2007  T:  04/06/2007  Job:  [id number]   cc:   Gwen Pounds, MD  Bernette Redbird, M.D.

## 2010-12-24 NOTE — Procedures (Signed)
EEG NUMBER:  [id number]   CLINICAL HISTORY:  A 77 year old woman admitted with altered mental  status, improved today.  EEG is for evaluation of altered mental status.   DESCRIPTION:  The dominant rhythm in this tracing is a moderate  amplitude alpha rhythm of approximately 9 Hz, which predominates  posteriorly, is better seen on the right than on the left, and  attenuates by eye opening and closing.  Low amplitude fast activity seen  frontally and centrally.  No focal slowing is noted and no epileptiform  discharges seen, although the amplitudes are relatively suppressed on  the left compared to the right throughout the recording.  Photic  stimulation produced driving responses, which were better seen on the  right than on the left.  Hyperventilation was not performed.  The  patient remained in the awake state throughout the recording.  Single-  channel devoted EKG revealed sinus rhythm throughout with a rate of  approximately 84 beats per minute.   CONCLUSION:  Mildly abnormal study due to the presence of relative  suppression of amplitudes on the left compared to the right, finding  which may indicate dysfunction involving the right hemisphere, but in  the absence of focal slowing, which is not seen on the study, is of  questionable significance.  No focal slowing is noted and no  epileptiform discharges seen.      Michael L. Thad Ranger, M.D.  Electronically Signed     UJW:[id number]  D:  11/17/2008 16:56:48  T:  11/18/2008 07:13:36  Job #:  [id number]

## 2010-12-24 NOTE — H&P (Signed)
[name], [name]                ACCOUNT NO.:  [id number]   MEDICAL RECORD NO.:  [id number]          PATIENT TYPE:  INP   LOCATION:  5501                         FACILITY:  [facility]   PHYSICIAN:  Gwen Pounds, MD       DATE OF BIRTH:  1931/05/30   DATE OF ADMISSION:  04/19/2007  DATE OF DISCHARGE:                              HISTORY & PHYSICAL   CHIEF COMPLAINT:  Difficulty with word-finding and intermittent aphasia.   HISTORY OF PRESENT ILLNESS:  This 77 year old female with multiple  medical problems was sent to the ED with confusion, blurry vision,  difficulty getting words out.  Dr. Denton Lank in the emergency room  evaluated the patient with EKG, labs, urinalysis, and cranial CT, all  negative.  Exam was nonfocal.  He thought she can go home with close  followup, but unfortunately when he went to discharge her she had  significant issues finding words, and I was called for inpatient  admission for possible CVA versus TIA.  MRI is ordered and currently  pending.  I am seeing her just after getting out of the MRI machine,  with preliminary report being negative.  She is confused, and cold, and  having some rigors.  All other organ systems are reviewed and negative.   PAST MEDICAL HISTORY:  Diabetes mellitus type 2, hypothyroid,  hyperlipidemia, diabetic neuropathy, fibromyalgia, history of  pericarditis, systemic lupus erythematosus, microalbuminuria, gout,  lymphocytic colitis, severe osteoporosis, inferior ramus fracture in  January 2006, history of gastritis, left and right cataract surgery, EF  50% to 60%, recurrent dehydration and azotemia with recent  hospitalization, nausea, vomiting, and diarrhea, with recent abdominal  ultrasound compatible with gallstones, but HIDA scan negative for  cholecystitis.  Status post bladder tack operation.  History of  polypectomy.   ALLERGIES:  ALTACE, HYZAAR, ACTOS, FOSAMAX, DIOVAN, BONIVA.   MEDICATION LIST:  1. Protonix 40 daily.  2.  Lasix 20 daily.  3. Vitamin D 50,000 units weekly.  4. Neurontin 400 t.i.d.  5. Synthroid 112 daily.  6. Lanoxin 125 daily.  7. Calcium Plus D daily.  8. Zetia 10 daily.  9. Prednisone 7.5 daily.  10.Questran 4 mg in 8 ounces of water b.i.d.  11.Reglan 50 mg t.i.d. a.c. for nausea.  12.Flagyl 500 mg daily until September 20.  13.Lantus 10 units at bedtime.   SOCIAL HISTORY:  She is widowed.  Four children.  She is retired.  No  tobacco, no alcohol.   FAMILY HISTORY:  Stroke, congestive heart failure and diabetes.   REVIEW OF SYSTEMS:  Per CMPL, she denies any current nausea, vomiting or  diarrhea.  She denies any chest pain or shortness of breath.  Abdominal  issues are currently under control.  All organ systems are reviewed and  currently negative.   PHYSICAL EXAMINATION:  VITAL SIGNS:  Blood pressure anywhere from 151/91  up to 202/99.  Respiratory rate is 20.  Heart rate 72 to 90.  Saturation  95% to 99% on room air.  GENERAL:  Alert and intermittently oriented.  She is currently has  intact speech,  and is making sense with her sentences.  She is shaking  cold.  She has diffuse weakness, which is global, but there is nothing  focal.  PULMONARY:  Clear to auscultation bilaterally.  HEENT:  Oropharynx is dry.  PERRL.  CARDIAC:  Regular.  NEUROLOGIC:  Cranial nerves II-XII are intact.  ABDOMEN:  Soft.  EXTREMITIES:  No edema.  She is moving all 4.  She was able to move off  the MRI gurney and onto her bed gurney without any difficulty.   ANCILLARY DATA:  Cranial CT:  Chronic microvascular changes, although  not that acute.  Sodium 133, potassium 4.8, chloride 102, bicarbonate  25, BUN 20, creatinine 1.1, glucose 168, calcium 9.9, albumin 3.9, AST  20, ALT 12.  Depakote level less than 10.  Urinalysis negative.  White  count 11.7, hemoglobin 13.5, platelet count 208.  Digoxin 1.3.   ASSESSMENT:  This is an elderly female with multiple medical problems  being admitted with  confusion and mental status changes, compatible with  cerebrovascular accident- transient ischemic attack versus medication  side effect.   HOSPITAL COURSE:  1. Admit.  2. MRI.  3. Check carotids.  4. Low-dose fluids.  5. Keep diabetes under control with her Lantus and sliding scale      insulin.  6. Hold Reglan as this may be causing some of the issues.  7. Check digoxin level, which was already done and was fine.  8. Discontinue Flagyl unless diarrhea recurs.  9. PT, OT and case worker have been consulted.  10.If MRI is compatible with stroke, consider neuro workup.      Currently, the gadolinium has been held due to her recent azotemia      and creatinine issues.  This may need to be re-evaluated if she      does not recover quickly.  11.Continue prednisone for her autoimmune disorder.  12.Diarrhea and GI issues currently under control.  13.Hypertension is severe, and we are working to get blood pressure      down.  Hydralazine will be given p.r.n., and Norvasc will be      started from here on.  14.Anemia, currently not anemic.  Her hemoglobin is fine.  15.Chronic leukocytosis.  This is the lowest I have seen her white      count in quite awhile.  The rest of her labs are fine.  Will follow      accordingly.      Gwen Pounds, MD  Electronically Signed     JMR/MEDQ  D:  04/19/2007  T:  04/20/2007  Job:  [id number]   cc:   Trudi Ida. Denton Lank, M.D.

## 2010-12-24 NOTE — H&P (Signed)
[name], [name]                ACCOUNT NO.:  [id number]   MEDICAL RECORD NO.:  [id number]          PATIENT TYPE:  INP   LOCATION:  3114                         FACILITY:  [facility]   PHYSICIAN:  [name], M.D.  DATE OF BIRTH:  Mar 20, 1931   DATE OF ADMISSION:  11/14/2008  DATE OF DISCHARGE:                              HISTORY & PHYSICAL   PRIMARY CARE PHYSICIAN:  Dr. Creola [name].   CHIEF COMPLAINT:  Speech changes.   HISTORY OF PRESENT ILLNESS:  [name] is a 77 year old white female  with a history of type 2 diabetes, hypertension, hyperlipidemia who  presented to the emergency department with a complaint of fall, and  subsequent back and pelvic pain.  Son states that she fell and  apparently hit her head.  She called her son after the fall and he  brought her to the emergency department, where she was complaining of  back and pelvic pain, initially.  While in the emergency department, she  subsequently developed acute change in her mental status with difficulty  with speech and word finding, and inappropriate speech pattern.  Associated symptoms including increased confusion, inappropriate  interaction with staff and her son.  She is disoriented and not  following commands.  Head CT done in the emergency department shows no  acute intracranial abnormalities, but extensive small vessel changes.  In the emergency department, her blood pressure was initially 131/71,  and subsequently up to 197/113, and now 163/105.  Her son states that  she has recently come off of her blood pressure medicines and has  decreased her diabetic medications, but does not remember her  medications specifically.  Evaluation in the emergency department has  included an extensive laboratory evaluation, which has been normal  except for elevated white blood count, which she has had in the past.  Interestingly, she had a similar episode of acute speech changes and  confusion in October 2008, at which  time she underwent an extensive  evaluation including MRI/carotids, and transthoracic echocardiogram,  which was all unrevealing.  She was evaluated at that time by Psychiatry  and placed on Remeron for depression.  She has not had any TIA symptoms  since then.  She has not had any changes in weakness or vision per her  son until this event this evening.   REVIEW OF SYSTEMS:  Unable to obtain due to the patient's mental status.   PAST MEDICAL HISTORY:  1. Diabetes with neuropathy and nephropathy.  2. Hypertension.  3. Hyperlipidemia.  4. Hypothyroidism.  5. Fibromyalgia.  6. Gout.  7. Chronic cholecystitis status post laparoscopic cystectomy (November      2009).  8. Depression/anxiety.  9. History of altered mental status in October 2008 due to possible      metabolic encephalopathy versus TIA versus medications as above.  10.Possible autoimmune disorder/possible lupus, on chronic steroids.  11.Chronic leukocytosis.  12.Anemia.  13.Severe osteoporosis with vitamin D deficiency and history of      inferior ramus fracture.  14.Colon polyps.  15.History of lymphocytic colitis.   CURRENT MEDICATIONS:  1. Lasix 40  mg daily.  2. Vitamin D 50,000 units.  3. Synthroid 112 mcg daily.  4. Lanoxin.  5. Zetia 10 mg daily.  6. Prednisone 5 mg daily.  7. Januvia 100 mg daily.  8. Aspirin 81 mg daily.  9. KCl 10 mEq daily.  10.Possibly on Tekturna, which was listed on her medication list in      the emergency department, though her son states that she stopped      taking blood pressure medicines in the past 6 months.   ALLERGIES:  1. ACTOS.  2. ALTACE.  3. DIOVAN.  4. FOSAMAX.  5. BONIVA.   SOCIAL HISTORY:  She is a widow with 4 children.  She lives with 1 of  her sons.  No tobacco, alcohol, or drug use.   FAMILY HISTORY:  Significant for stroke, heart failure, and diabetes.   PHYSICAL EXAMINATION:  VITAL SIGNS:  Temperature 98.0; blood pressure  131/71 initially,  subsequently 197/113, and currently 163/105; heart  rate initially 84, currently 118; respirations 18, oxygen saturation 99%  on room air.  GENERAL:  She is disoriented, would not follow commands.  She has  disjointed speech and difficulty with word finding.  HEENT:  Pupils equal, round, and reactive to light.  Extraocular  movements intact.  Oropharynx is moist.  NECK:  Supple without lymphadenopathy, JVD, or carotid bruits.  HEART:  Regular rate and rhythm without murmurs, rubs, or gallops.  LUNGS:  Clear to auscultation bilaterally.  ABDOMEN:  Soft, nondistended, nontender with normoactive bowel sounds.  EXTREMITIES:  No clubbing, cyanosis, or edema.  NEUROLOGIC:  She has no facial asymmetry.  She moves all extremities.  She will not squeeze hands on command.  There is no apparent neglect.  Deep tendon reflexes are 1+ and symmetric.  No clonus.  Exam is  significantly limited by the patient's cooperation.   LABORATORY DATA:  CBC shows a white count of 18,000 with 85% segs and 7%  lymphocytes, hemoglobin 13.3, and platelets 200.  BMET significant for  sodium 135, potassium 3.5, chloride 100, bicarb 27, BUN 17, creatinine  0.9, glucose 155.  Acetaminophen, aspirin, digoxin levels are all  undetectable.  Ammonia level 20.  Urinalysis negative except for protein  of greater than 300.  Cardiac enzymes are negative.  Liver function  tests are normal.   STUDIES:  1. CT of the head shows no acute intracranial abnormalities.  This was      performed at 2020.  Moderate-to-marked small vessel ischemic      changes, and minimal fluid in the left mastoid air cells.  Lumbar      spine x-ray shows an age indeterminate L2 compression deformity,      which is new compared to December 2007.  2. Right shoulder x-ray, no acute findings.  Calcific tendinopathy of      the rotator cuff.  3. Chest x-ray, no acute cardiopulmonary disease.  4. EKG, sinus tachycardia with PVCs, incomplete right bundle  branch      block, nonspecific ST changes.   ASSESSMENT/PLAN:  1. Acute altered mental status with Broca aphasia status post fall -      the acute onset several hours after a fall is concerning for      possible concussion versus acute brain injury versus acute stroke      versus developing subdural hematoma, which was not seen on her      initial CT scan.  The similarity to the episode in October 2008 is  intriguing, but does not appear to be a pure psychological      abnormality.  This does not appear to be distractible.  We will      admit her to a step-down telemetry bed and obtain an MRI of the      brain.  I have discussed this case with Dr. Vickey Huger from      Neurology, who will see the patient tonight.  We will obtain urine      drug screen, alcohol, cortisol, ANA, and sedimentation rate.  We      will obtain fasting lipid profile in the morning.  The patient does      not qualify for tPA due to the fall, markedly elevated blood      pressure, and duration of symptoms.  We will obtain a speech      therapy, physical therapy, and occupational therapy evaluation.  2. Hypertension - acute increase in blood pressure was temporally      associated with her change in mental status with her blood pressure      has improved a bit now with persistent mental status changes.  We      will avoid over aggressive treatment of blood pressure to prevent      hypotension, in the case if she has had a stroke.  Goal will be to      maintain systolic blood pressure of 140-160.  3. Type 2 diabetes - continue Januvia once she is tolerating orals.      We will cover with sliding-scale insulin.  4. Leukocytosis - this is chronic.  We will monitor.  No evidence of      infection.  5. Deep vein thrombosis prophylaxis.  Sequential compression devices      pending evaluation to rule out subacute bleed.      [name], M.D.  Electronically Signed     WS/MEDQ  D:  11/14/2008  T:   11/15/2008  Job:  [id number]   cc:   Gwen Pounds, MD

## 2010-12-24 NOTE — Consult Note (Signed)
Jenna, [name]                ACCOUNT NO.:  [id number]   MEDICAL RECORD NO.:  [id number]          PATIENT TYPE:  IPS   LOCATION:  4029                         FACILITY:  [facility]   PHYSICIAN:  John C. Madilyn Fireman, M.D.    DATE OF BIRTH:  Mar 07, 1931   DATE OF CONSULTATION:  11/28/2008  DATE OF DISCHARGE:                                 CONSULTATION   We were asked to see [name] today in consultation for drop in  hemoglobin with heme-positive stools by Dr. Riley Kill of the Rehabilitation  Service.   HISTORY OF PRESENT ILLNESS:  This is a 77 year old female, a patient of  Dr. Donavan Burnet, who was admitted with altered mental status and speech  changes after a fall on November 14, 2008.  She was found to have fractures  in both her L2 vertebrae and her pelvic rami.  Her mental status cleared  over time.  She had a normal head CT.  She is now in the Rehabilitation  Service.  Her hemoglobin has dropped acutely twice, down to 7.3.  She  has received 2 units of packed red blood cells each time.  Today, she  reports left upper quadrant pain, one episode of vomiting, one episode  of diarrhea.  She has been weak since her laparoscopic cholecystectomy  in November 2009.  Her history of chronic diarrhea has resolved.  Her  last upper endoscopy was in October 2009, it was normal.  Her last  colonoscopy was in May 2008.  She was found to have a 3-mm tubular  adenomatous polyps.  Biopsies at that time showed active colitis.   Past medical history is significant for:  1. Diabetes mellitus with neuropathy and nephropathy.  2. Hypertension.  3. She has a history of hypertensive encephalopathy.  4. Hypothyroidism.  5. Gout.  6. She has a possible lupus and is on chronic steroids.  7. She has osteoporosis with vitamin D deficiency.  8. Colon polyps.  9. Lymphocytic colitis.  10.Cholecystitis.  She is status post cholecystectomy in November      2009.  11.History of depression.  12.She is status post  hysterectomy.   Her primary care physician is Dr. Creola Corn.  Her hematologist is Dr.  Eli Hose.   current medications in the hospital include an 81 mg aspirin as well as  Lovenox, Protonix and her 5 mg of prednisone daily.  The rest are as per  chart.   ALLERGIES:  She has multiple medication allergies including ALTACE,  ACTOS, HYZAAR, FOSAMAX, DIOVAN, NEURONTIN, ZETIA, and LANTUS.   REVIEW OF SYSTEMS:  As per HPI but include hip and back pain as well as  fatigue.   Social history is negative for alcohol, tobacco and drugs.  She lives  with her 2 sons who care for her.   Family history is negative for GI disease.   On physical exam, she is alert and oriented, has somewhat of a flat  affect.  Temperature is 97, pulse 106, respirations 20, blood pressure  is 152/84.  Heart has a regular rate and rhythm with no obvious murmurs,  rubs, or gallops.  Her lungs are clear.  Her abdomen, she has mild  tenderness in her left upper quadrant, nondistended, soft, and has good  bowel sounds.   Labs show a hemoglobin of 10.3 today.  She was 7.3 on November 24, 2008,  and received a 2-unit blood transfusion.  On admission, her hemoglobin  was 13 and look like it had been stable at 13 for a while.  Her BUN is  10, creatinine 1.04.  Guaiac positive on November 25, 2008, November 26, 2008,  and November 27, 2008.   Radiological exams include a CT of her abdomen and pelvis done on November 22, 2008, to rule out a retroperitoneal bleed.  She was found to be  negative for retroperitoneal bleed.  It did show the compression  fracture in L2 as well as a calcified aorta and calcified aortic and  mitral valve.   ASSESSMENT:  Dr. Dorena Cookey has seen and examined the patient, collected  her history, and reviewed her chart.  His impression is that this is a  77 year old female with acute drop in hemoglobin and heme-positive  stools.  We will keep her on IV Protonix, consider stopping her Lovenox  for the rest  of her hospital stay.  We have scheduled an upper endoscopy  for tomorrow morning at 7:30 a.m. on November 29, 2008, with Dr. Molly Maduro  Buccini at the patient's request.   Thanks very much for this consultation.      Stephani Police, PA    ______________________________  Everardo All Madilyn Fireman, M.D.    MLY/MEDQ  D:  11/28/2008  T:  11/29/2008  Job:  [id number]   cc:   Bernette Redbird, M.D.

## 2010-12-24 NOTE — Consult Note (Signed)
[name], [name]                ACCOUNT NO.:  [id number]   MEDICAL RECORD NO.:  [id number]          PATIENT TYPE:  IPS   LOCATION:  4029                         FACILITY:  [facility]   PHYSICIAN:  Coletta Memos, M.D.     DATE OF BIRTH:  [birth year]   DATE OF CONSULTATION:  11/29/2008  DATE OF DISCHARGE:                                 CONSULTATION   CHIEF COMPLAINT:  Back pain, L2 compression fracture.   INDICATIONS:  [name] is a 77 year old woman who was admitted to the  hospital on November 14, 2008, via the emergency room.  At that time, she  was admitted for back pain, pelvic pain, and she also had some speech  changes.  She had fallen while in her house.  Her son does not know  exactly how she fell.  [name] does not remember the details either,  but after she fell, she had significant pain in her back and in her  pelvis.  When she was under the emergency department, she had an acute  mental status change with gargled speech, confusion, and disorientation.  She did not follow commands.  Head CT was performed that showed no acute  changes, had small vessel changes.  Blood pressure was elevated to a  high of 197/113.  She was subsequently admitted, that day she had  further x-rays, which showed a compression fracture at L2.  She had x-  rays of the right shoulder, which did not show problem.   She was eventually was seen by Neurology and no abnormalities were  identified and she was admitted.  She was then placed on the rehab  service where she was admitted on November 22, 2008.  At that point, she  still had not received any specific treatment for the compression  fractures that she was still complaining of significant amount of pain.  She was eventually seen for pelvis and noted to have a pelvic fracture  and I was called with regards to the compression fracture.   PAST MEDICAL HISTORY:  1. Diabetes type 2 with peripheral neuropathy.  2. Nephropathy.  3. Hypertension.  4.  Dyslipidemia.  5. Fibromyalgia.  6. Osteoporosis.  7. Vitamin D deficiency.  8. Cholelithiasis.  9. Chronic leukocytosis.  10.Lymphocytic colitis.  11.Anemia.  12.Altered mental status.  13.Autoimmune disorder.  14.Possible lupus.  15.Depression.  16.Anxiety.  17.Colon polyps.  18.Gout.  19.Gastritis.  20.Hypothyroidism.  21.Pericarditis.   FAMILY HISTORY:  Positive for stroke, for cerebrovascular accident, and  for diabetes.   MEDICATIONS ON ADMISSION:  Lasix, vitamin D, Synthroid, Lanoxin, Zetia,  prednisone, Januvia, aspirin, potassium chloride, and Tekturna.   ALLERGIES:  1. ACTOS.  2. ALTACE.  3. DIOVAN.  4. FOSAMAX.  5. BONIVA.   SOCIAL HISTORY:  She is a widow with 4 children.  Lives with one son.  She does not use tobacco, does not use alcohol or illicit drugs.  Congestive heart disease also present in the family history.   PHYSICAL EXAMINATION:  VITAL SIGNS:  Currently; 98 temperature, pulse  87, respiratory rate 20, blood pressure 149/71, and oxygen  saturation is  92%.  GENERAL:  She is alert.  She is oriented x4.  She cannot remember the  details of fall approximately a week ago.  She has significant back pain  and what she says pain in her clots.  She does not detail, which side  the pain is on, but she says maybe it is a right side, which is worse.  EXTREMITIES:  She has 5/5 strength in the lower extremities and normal  strength in the upper extremities.  A 2+ reflexes at the knees.  Downgoing toes plantar stimulation.  No clonus.  She has intact  proprioception at the feet bilaterally.  Light touch is good until about  the level of the ankle and distal to that bilaterally.  NEUROLOGIC:  Memory language, attention, span, and fund of knowledge are  otherwise good.  Symmetric facies.  Symmetric facial movements.  Tongue,  uvula midline.  The extraocular movements.  Full visual fields on  confrontation.  Shoulder shrug is normal.  Normal pulses at the  wrists  bilaterally.  LUNGS:  Clear.  HEART:  Regular rhythm and rate.  No murmurs or rubs appreciated.   X-ray shows a L2 compression fracture, minimal retropulsion, CT confirms  these findings.  Alignment is otherwise good.  She has degenerative  changes at L2-3, L3-4, L4-5, and L5-S1.  She has some stenosis present  at that level.   DIAGNOSES:  L2 compression fracture.   Although, Theisen has some retropulsion.  Given her age and given the  fact, she has a normal neurologic examination.  I would rather see her  fail bracing versus any type of operative treatment at this point in  time.  There is no real known benefit to vertebroplasty that is very  popular procedure.  If she can heal this on her own, I would think she  will be in much better standing.  Certainly, I have absolutely no desire  to do any type of operative procedure as we would have to cross the  thoracolumbar junction, and again, she is not having any neurologic  crisis at this point in time.  We will have to see if her pain improves,  right now it is certainly not radicular.  So therefore again, I would  just recommend bracing, she indicated that to the rehab PA.  I will  follow along.           ______________________________  Coletta Memos, M.D.     KC/MEDQ  D:  11/23/2008  T:  11/24/2008  Job:  [id number]

## 2010-12-24 NOTE — Consult Note (Signed)
[name], [name]                ACCOUNT NO.:  [id number]   MEDICAL RECORD NO.:  [id number]          PATIENT TYPE:  INP   LOCATION:  3022                         FACILITY:  [facility]   PHYSICIAN:  Jene Every, M.D.    DATE OF BIRTH:  [birth year]   DATE OF CONSULTATION:  11/20/2008  DATE OF DISCHARGE:                                 CONSULTATION   CHIEF COMPLAINT:  Pelvic pain.   HISTORY:  This is a pleasant 77 year old female who was admitted to the  hospital to Dr. Jonny Ruiz Russo's service after a fall.  She apparently had  mental status changes and elevated blood pressure.  She was admitted for  workup and observation.  During that workup, the patient was noted to  have a fracture of the inferior pubic rami of the pelvis.  He has been  admitted and has had pain for that and has had difficulty with her  ambulation.   The patient reports no numbness or tingling into the lower extremities  prior to fall.  She has been taking a pain medicine for that.   She denies any fevers or chills.   PAST MEDICAL HISTORY:  1. Diabetes neuropathy.  2. Hypothyroidism.  3. Hypertension.  4. Fibromyalgia.  5. Gout.  6. Depression and anxiety.  7. Auto-immune disorder.  8. Anemia.  9. Leukocytosis.  10.Osteoporosis.  11.Vitamin D deficiency.  12.History of inferior pubic ramus fracture.  13.She had a history of lumbar disk disease.   MEDICATIONS:  Lasix, Synthroid, Lanoxin, Zetia, prednisone, and aspirin.  She is also taking Vicodin.   ALLERGIES:  ACTOS, ALTACE, DIOVAN, FOSAMAX, and BONIVA.   SOCIAL HISTORY:  Widow with four children.  Lives with one of her sons.   Negative tobacco.  Negative EtOH.   FAMILY HISTORY:  Stroke, coronary disease, and diabetes.   PHYSICAL EXAMINATION:  Sitting upright and mildly anxious.  She is  tender in the lumbosacral junction and left-to-right pubic rami.  Rotation of her hip does not produce significant pain.  Pelvis stable on  compression.  Nontender  with the SI joints.  Motor is 5/5 in lower  extremities.  Straight leg raise is negative.  1+ dorsalis pedis and  posterior tibial pulses.   IMAGING:  Radiographs of the pelvis demonstrate inferior and superior  pubic ramus fractures, partially healed.   IMPRESSION:  Inferior and superior pubic rami fractures, acute on  chronic status post a recent fall, ambulatory dysfunction.   PLAN:  1. Recommend transfer with mobilization as tolerated utilizing full      weightbearing on lower extremities utilizing a walker when      appropriate.  2. Analgesics p.r.n.  Hydrocodone should be of sufficient.  3. Probably DVT prophylaxis.  I would recommend a repeat radiograph in      my office in 2 weeks for evaluation.  Typically, in a pubic rami      fracture it will generally take anywhere from 6-10 weeks to heal.      Approximately 2-1/2 to 3 weeks following the fracture the pain      associated with  that dramatically diminishes in terms.  At that      point, mobilization is much more successful.  She may have an issue      in the interim.   I appreciate the consultation.  If there are any further questions prior  to her followup, please feel free to contact me.  At this moment of  time, I would recommend no other intervention.  The mobilization is as  tolerated and analgesics p.r.n. with utilization of assistive devices.      Jene Every, M.D.  Electronically Signed     JB/MEDQ  D:  11/20/2008  T:  11/21/2008  Job:  [id number]

## 2010-12-24 NOTE — H&P (Signed)
NAMECAITLAND, [name] NO.:  [id number]   MEDICAL RECORD NO.:  [id number]          PATIENT TYPE:  INP   LOCATION:  5730                         FACILITY:  [facility]   PHYSICIAN:  Gwen Pounds, MD       DATE OF BIRTH:  1931-05-29   DATE OF ADMISSION:  03/12/2007  DATE OF DISCHARGE:                              HISTORY & PHYSICAL   PRIMARY CARE PHYSICIAN:  Dr. Creola Corn.   PRIMARY GI:  Dr. Nadine Counts Buccini.   CHIEF COMPLAINT:  Weakness, failure to thrive, dehydration, azotemia,  and diarrhea.   HISTORY OF PRESENT ILLNESS:  This is a 77 year old female here for  continued symptoms of weakness, failure to thrive, shaking, nausea,  diarrhea and dehydration.  She saw me March 10, 2007, and we did labs.  Her BUN was 63, creatinine was 2.1, calcium was 11.2.  The LFTs were  normal.  TSH was fine.  Potassium was 6.2.  She was told to hold her  Lasix, increase her fluids, and we were going to repeat her labs today.  She showed up today to repeat these labs, and she just looked ill, and  we went ahead and admitted her for further evaluation and treatment of  these worsening symptoms.   Of note, Dr. Nadine Counts Buccini recently did a repeat colonoscopy on her on Dec 18, 2006, which showed a patchy area of mildly erythematous mucosa, which  was found in the sigmoidoscopy.  Biopsies were taken.  There was eroded  mucosa in the ileal cecal valve.  There was polyp noted, it was  biopsied.  The terminal ileum was normal, and there was an overall  significant improvement in the exudative colitis in the proximal colon  as seen in December.  No current findings to explain her diarrhea.  Biopsy reports came back at tubular adenoma, no high-grade dysplasia,  benign small bowel mucosa.  There was an area of focal active colitis  associated with glandular atypia, and there was benign colonic mucosa in  a random sampling.   She has been seeing Dr. Matthias Hughs on a regular basis, and in his note, he  talks about her previous lymphocytic colitis.  He talks about her severe  colitis on colonoscopy performed by Dr. Madilyn Fireman in the cecum in the  sigmoid region, which got better after treating with Flagyl.  Even  though she was C. diff negative, the biopsies were thought to be  compatible pseudomembranous colitis.  He talks about her only having a  couple of bowel movements a day, formed stool, not liquid.  After coming  off of Flagyl, she did have a flair of diarrhea and got better with  Flagyl, and it was determined that she would stay on the Flagyl for  about a 3 month period of time.  He has been seeing her fairly  regularly.   PAST MEDICAL HISTORY:  1. Type 2 diabetes.  2. Hypothyroid.  3. Hyperlipidemia.  4. Diabetes neuropathy.  5. Fibromyalgia.  6. History of percarditis.  7. Systemic lupus erythematosus.  8. Autoimmune syndrome.  9. History  of pancreatitis.  10.Macroalbuminuria.  11.Gout.  12.Lymphocytic colitis versus recurrent C. diff.  13.Severe osteoporosis.  14.History of inferior ramus stress fracture in January 2006.  15.History of gastritis.  16.Left and right cataract surgeries.  17.EF 50-60%.  18.Status post bladder tack operation.  19.Polypectomy in January 2003.  20.Peripheral field defect.   ALLERGIES:  1. ALTACE.  2. HYZAAR.  3. ACTOS.  4. FOSAMAX.  5. DIOVAN.  6. BONIVA.   MEDICATIONS:  Include:  1. Flagyl 500 t.i.d.  2. Protonix 40 daily.  3. Starlix 60 mg b.i.d.  4. Pectin 150 daily.  5. Januvia 100 daily.  6. Lasix 20 mg per day, currently on hold.  7. Vitamin D 50,000 units per week.  8. Neurontin 400 t.i.d.  9. Synthroid 112 mcg daily.  10.Lanoxin 0.125 daily.  11.Calcium plus D.  12.Zetia 10 daily.  13.Prednisone 7.5 mg p.o. daily.   SOCIAL HISTORY:  She is widowed and has four children.  She is retired.  No tobacco or alcohol.   FAMILY HISTORY:  Stroke, congestive heart failure and diabetes.   REVIEW OF SYSTEMS:  Weakness,  failure to thrive, abnormal weight loss,  urinary symptoms.  No fever, but chills and shakes.  Positive nasal  discharge.  Positive headaches.  Positive nausea and no vomiting.  Positive diarrhea, 4 times last night, and 4 times during the day, so  about 8-10 times a day.  A little abdominal pain.  She is on Flagyl per  Dr. Matthias Hughs.  No urinary symptoms.  Mild chronic achyness.  No chest  pain.  No shortness of breath.  No dyspnea on exertion.  All other  systems are negative.   PHYSICAL EXAMINATION:  VITAL SIGNS:  Heart rate 76, weight is 136  pounds, blood pressure is 150/78.  GENERAL:  Alert and oriented.  HEENT:  Oropharynx is dry.  NECK:  No JVD.  No lymphadenopathy.  PULMONARY:  Clear to auscultation bilaterally.  CARDIAC:  Regular.  ABDOMEN:  Soft with hyperactive bowel sounds.  EXTREMITIES:  No edema.   ASSESSMENT:  This is an elderly patient with lots of medical problems:  1. Failure to thrive.  2. Weakness.  3. Azotemia secondary to dehydration from continued diarrhea.   PLAN:  1. Admit.  2. Labs.  3. Fluids per the IV.  4. Add cholestyramine.  5. Continue Flagyl.  6. Get GI involved.  7. Labs, x-rays and cultures have been ordered her last time.  8. Anemia; her hemoglobin was 7.2 per heme, and she got what appeared      to be Aranesp.  9. Hypothyroid; her last TSH was fine.  10.We will work on controlling her diabetes with just insulin for now.  11.Hold Glucerna with dehydration and current kidney issues,      especially with the azotemia.  12.Consider ultrasound.  13.Autoimmune disorder; we will increase her steroids at this current      in case there is some element.  14.DVT prophylaxis ordered.  15.The patient has chronic leukocytosis.      Gwen Pounds, MD  Electronically Signed     JMR/MEDQ  D:  03/12/2007  T:  03/13/2007  Job:  [id number]   cc:   Bernette Redbird, M.D.  John C. Madilyn Fireman, M.D.

## 2010-12-24 NOTE — H&P (Signed)
NAMECATHERYN, SLIFER NO.:  [id number]   MEDICAL RECORD NO.:  [id number]          PATIENT TYPE:  IPS   LOCATION:  4029                         FACILITY:  [facility]   PHYSICIAN:  Ranelle Oyster, M.D.DATE OF BIRTH:  [birth year]   DATE OF ADMISSION:  11/22/2008  DATE OF DISCHARGE:                              HISTORY & PHYSICAL   CHIEF COMPLAINT:  Back and pelvic pain and intermittent confusion.   PRIMARY CARE PHYSICIAN:  Gwen Pounds, MD   HISTORY OF PRESENT ILLNESS:  This is a 77 year old white female with  diabetes and hypertension who fell on her back and hip on April 6.  She  was evaluated in the emergency room where she developed acute mental  status changes and speech difficulties.  She was found to have  concomitant increase in blood pressure as well.  MRI/MRA of brain was  without acute findings.  She had a similar episode apparently in October  2008.  Neurology was consulted and felt she had a hypertensive  encephalopathy.  EEG was done and showed diffuse mild abnormalities.  The patient with resolution of her confusion gradually with ongoing pain  in her pelvis.  X-ray of the pelvis on April 12, showed healing  acute/subacute right superior inferior pubic ramus fracture and  degenerative changes in the lumbar spine and SI joints.  Dr. Jillyn Hidden was  consulted for input.  He recommended weightbearing as tolerated with  followup x-rays in 2 weeks.  The patient has had ongoing problems with  pain, safety when up.  She has fear of falling as well.  Evidently, her  blood count has steadily dropped to where her hemoglobin is 7.3 today.   Rehab was consulted yesterday and felt that she could benefit from an  inpatient admission and thus she was brought today.   REVIEW OF SYSTEMS:  Notable for low back pain, left knee pain, pelvic  pain, abdominal and flank pain, weakness, anxiety, urinary urgency.  Cognition is generally improving.  She denies any shortness of  breath or  chest pain.  Full reviews in the written H and P.  Other pertinent  positives are above.   PAST MEDICAL HISTORY:  Positive for diabetes type 2 with neuropathy,  nephropathy, hypertension, dyslipidemia, fibromyalgia, osteoporosis with  vitamin D deficiency, lap chole in November 2009, chronic leukocytosis,  lymphocytic colitis, anemia, bladder tack, altered mental status with  question of TIA in the past versus encephalopathy, autoimmune  disorder/questionable lupus, depression/anxiety, colon polyps, gout,  gastritis, hypothyroidism, and pericarditis.   FAMILY HISTORY:  Positive for stroke and diabetes.   SOCIAL HISTORY:  The patient is widowed, lives with two sons who work  alternating shifts who can help at home upon discharge.  She denies  alcohol or tobacco use.   ALLERGIES:  1. ZETIA.  2. LANTUS.  3. ALTACE.  4. ACTOS.  5. DIOVAN.  6. HYZAAR.  7. BONIVA.  8. NEURONTIN.   HOME MEDICATIONS:  Aspirin, Januvia, Lanoxin, Lasix, KCl, Synthroid,  prednisone, vitamin D, and Zetia.   Hemoglobin 7.3.  Sodium 133, potassium 3.8, BUN 59,  creatinine 1.09.   PHYSICAL EXAMINATION:  VITAL SIGNS:  Blood pressure is 125/79, pulse is  88, respiratory rate is 20, temperature 96.9.  GENERAL:  The patient is generally pleasant, appearing anxious, sitting  in bed in supine position.  HEENT:  Pupils equal, round, and reactive to light.  Ear, nose, and  throat exam is notable for missing teeth and borderline dentition.  Mucosa is pink and moist otherwise.  NECK:  Supple without JVD or lymphadenopathy.  CHEST:  Clear to auscultation bilaterally without wheezes, rales, but  only occasional rhonchi.  HEART:  Regular rate and rhythm without murmurs, rubs, or gallops.  EXTREMITIES:  No clubbing, cyanosis, or edema.  ABDOMEN:  Soft and bowel sounds are positive.  She is tender in the  hypogastric area as well as the flank in addition to low back areas.  She had ecchymoses on the area  of the left hip as well as the left groin  area.  There are no ecchymoses along the pelvic region or stomach.  NEUROLOGIC:  Cranial nerves II through XII are intact.  Reflexes are 2+  to 1+.  Sensation is decreased distally in the legs.  Strength generally  is 4/5 to 5/5 in the upper extremities.  Lower extremity strength was 1-  2/5 left hip, 2/5 to 3/5 right hip; left knee was 2-3/5, right knee 3/5,  bilateral ankle movement is 4/5.  Judgment, orientation, and memory are  all appropriate.  Affect was generally a bit flat, but at times anxious.   POST ADMISSION PHYSICIAN EVALUATION:  1. Functional deficits secondary to hypertensive encephalopathy as      well as fall with right superior/inferior pubic rami fracture.  The      patient is weightbearing as tolerated.  2. The patient is admitted to receive collaborative interdisciplinary      care between the physiatrist, rehab nursing staff, and therapy      team.  3. The patient's level of medical complexity and substantial therapy      needs in context of that medical necessity cannot be provided a      lesser intensity of care.  4. The patient has experienced substantial functional loss from her      baseline.  Upon functional assessment which was performed      yesterday, the patient is min assist grooming for standing balance,      mid assist to mod assist with transfers, min to guard assist      ambulating 20 feet with a rolling walker.  The patient needs a lot      of assistance due to pain and the safety awareness.  The patient      was independent prior to arrival but did not drive.  Based on the      patient's diagnosis, physical exam, and functional history, she has      a potential for functional progress, which will result in      measurable gains while in inpatient rehab.  These gains will be of      substantial and practical use upon discharge to home in      facilitating mobility and self-care.  Interim changes in the       medical status since our preadmission screening are detailed in the      history of present illness above.  5. Physiatrist will provide 24-hour management of medical needs as      well as oversight of the therapy plan/treatment and provide  guidance as appropriate regarding interaction of the two.  Medical      problem list and plan are listed below.  6. 24-hour rehab nursing will assist in management of the patient's      skin care needs as well as pain management, nutrition, bowel and      bladder management and integration of therapy concepts and      techniques.  7. PT will assess and treat for lower extremity strength, pain      tolerance, functional mobility, gait, adaptive equipment, and      strengthening with goals modified independent to supervision.  8. OT will assess and treat for upper extremity strength, ADLs,      adaptive equipment, safety awareness, family education, and goals      overall are supervision to modified independent.  9. Case management/social worker will assess and treat for      psychosocial issues and discharge planning.  10.Team conferences will be held weekly to assess progress towards      goals and to determine barriers at discharge.  11.The patient has demonstrated sufficient medical stability and      exercise capacity to tolerate at least 3 hours of therapy per day      at least 5 days per week.  12.Estimated length of stay is 7 plus days.  Prognosis is good.   MEDICAL PROBLEM LIST AND PLAN:  1. Acute blood loss anemia:  Hemoglobin is further decreased today to      7.3.  No obvious signs of outward bleeding are noted.  We will      order CT without contrast of the abdomen, pelvis, and lumbar spine      to assess for bleed and/or occult fractures as the patient has      ongoing pain in her back and flanks.  We will transfuse 2 units of      packed red blood cells today.  Heme check stools as well.  2. Diabetes type 2:  Monitor CBGs a.c. and  at bedtime.  Continue      Januvia per home regimen and as we move forward, observe for signs      and symptoms of hyper or hypoglycemia.  3. Dehydration.  The patient with increasing BUN upon last set of      labs.  We will push p.o. fluids and hold on any diuretics.  We will      follow metabolic panel serially.  4. Autoimmune disorder with chronic leukocytosis.  5. Hypertension:  Continue with Apresoline per routine.  The patient's      blood pressure is fairly well controlled at this point.  The      patient is off Lasix and Lanoxin at this point.  6. Bowels:  Continue Senokot-S as well as MiraLax for daily movements.      Use Dulcolax p.r.n.  7. Cardiovascular prophylaxis with aspirin 81 mg p.o. daily for now.  8. DVT prophylaxis subcu Lovenox.  Hold for now until we discern the      extent of bleeding or source of blood loss.      Ranelle Oyster, M.D.  Electronically Signed     ZTS/MEDQ  D:  11/22/2008  T:  11/23/2008  Job:  [id number]

## 2010-12-24 NOTE — Consult Note (Signed)
NAMEGERLINE, RATTO                ACCOUNT NO.:  [id number]   MEDICAL RECORD NO.:  [id number]          PATIENT TYPE:  INP   LOCATION:  5501                         FACILITY:  [facility]   PHYSICIAN:  Antonietta Breach, M.D.  DATE OF BIRTH:  05-03-1931   DATE OF CONSULTATION:  04/22/2007  DATE OF DISCHARGE:                                 CONSULTATION   REQUESTING PHYSICIAN:  Creola Corn, MD   REASON FOR CONSULTATION:  Difficulty word-finding, flat affect,  evaluate, recommend treatment.   HISTORY OF PRESENT ILLNESS:  Mrs. Aahna Rossa is a 77 year old female  admitted to the [facility] on April 19, 2007 due to confusion,  difficulty word-finding and intermittent aphasia.   Mrs. Paulette has been suffering from a number of general medical problems;  please see below.  She has approximately 6 weeks of progressive  development of low energy, difficulty concentrating, poor appetite and  insomnia.  She has had consistent denial of depressed mood.  She states  that she is very frustrated with her periods where she cannot focus.  She has excellent insight into the symptoms above.   She is completely oriented and her memory function is intact.  She is  cooperative with bedside care.   The patient's organic workup has been negative.  Please see below.   PAST PSYCHIATRIC HISTORY:  Mrs. Kocian does have a history of excessive  feeling on edge, worry, muscle tension.   On past medical record review, she was being prescribed Xanax in  February 2007; also in November 2007, anxiety was mentioned   There is no known history of major depression.   FAMILY PSYCHIATRIC HISTORY:  None known.   SOCIAL HISTORY:  Mrs. Mesina is retired.  She does not use alcohol or  tobacco.  She does not use illegal drugs.  Marital status:  Widowed.  Children:  Four.  She has family locally who are supportive.  She has  been living by herself.   PAST MEDICAL HISTORY:  1. Diabetes type 2.  2. Hypothyroidism.  3.  Hyperlipidemia.  4. Diabetic neuropathy.  5. fibromyalgia.  6. History of pericarditis.  7. Systemic lupus erythematosus.  8. Microalbuminuria.  9. Gout.  10.Lymphocytic colitis.  11.Severe osteoporosis.  12.History of gastritis.  13.Left and right cataract surgery.  14.History of polypectomy.   ALLERGIES:  Ramipril, Actos, Cozaar, Fosamax, Diovan.   MEDICATIONS:  The MAR is reviewed.  The patient is not currently on any  psychotropic medications.  She is on Synthroid 112 mcg daily and  prednisone 75 mg daily.   LABORATORY DATA:  Sodium 136, potassium 4, chloride 100, bicarb 26, BUN  30, creatinine 1.6, glucose 122, SGOT 18, SGPT 10, albumin 3.4.  CBC:  WBC 11.6, hemoglobin 13.3, platelet count 206,000.  Ammonia level 19.  TSH within normal limits.  ESR was normal at 22.   Head CT without contrast on September 8 showed chronic microvascular  white matter disease, mild atrophy, no acute or focal abnormality.  No  change from previous exam.   The MRI of the brain without contrast on April 19, 2007  showed  atrophy and chronic small vessel change without evidence for acute  intracranial findings.   REVIEW OF SYSTEMS:  CONSTITUTIONAL:  Afebrile.  The patient's weight is  currently 60.8 kg.  HEAD:  No trauma.  EYES:  No visual changes.  EARS:  No hearing impairment.  NOSE:  No rhinorrhea.  MOUTH/THROAT/NOSE:  No  sore throat.  NEUROLOGIC:  No focal motor or sensory deficit.  PSYCHIATRIC:  As above.  CARDIOVASCULAR:  No chest pain or palpitations.  RESPIRATORY:  No coughing or wheezing.  GASTROINTESTINAL:  The patient  denies nausea.  There is no vomiting.  GENITOURINARY:  No dysuria.  SKIN:  Unremarkable.  ENDOCRINE/METABOLIC:  No heat or cold intolerance.  MUSCULOSKELETAL:  No deformities.  HEMATOLOGIC/LYMPHATIC:  Unremarkable,  except for a slight leukocytosis at 11.6.  The patient is on prednisone.   EXAMINATION:  VITAL SIGNS:  Temperature 97.8, pulse 100, respiratory   rate 17, blood pressure 92/64.  CBG 235.  O2 saturation on room air 97%.  GENERAL APPEARANCE:  Mrs. Capell is an elderly female appearing her  chronologic age, lying in a supine position in her hospital bed.  She  does not have any abnormal involuntary movements.  She is well-groomed.   OTHER MENTAL STATUS EXAM:  Mrs. Hulgan is alert.  Her eye contact is  fairly poor.  Her attention span is mildly decreased.  Her concentration  is also decreased.  She is slow to respond to questions and drifts;  however, she does not did demonstrate word-finding difficulty; she just  takes a significant amount of time to thought process.   On orientation testing, she does get the day of the month wrong and  thinks it is the 17th, but otherwise, orientation is completely within  normal limits.  Memory testing -- 3/3 words immediate and 3/3 words at 3  minutes without any rehearsal or prompting.   Speech does involve a flat prosody.  There are some delays that she has  difficulty remaining on the topic; however, she does not have  dysarthria.   Thought process as above.  The patient does not have disorganization.  She does fine words correctly.  She does have a slight slowness of  thought process.  There is no looseness of associations.  Her language  expression is as above.  Her comprehension is intact, abstraction  intact.  Thought content:  No thoughts of harming herself, no thoughts  of harming others, no delusions, no hallucinations, no ideas of  reference.   Affect is flat.  Mood:  The patient denies depressed mood; however, she  does appear to have a stoic disposition.  Her insight is intact.  Her  judgment is grossly within normal limits.   The patient can learn as is demonstrated by her understanding of the  discussion of major depression involving multiple categories as  symptoms.   ASSESSMENT:   AXES I:  1. 293.83, mood disorder, not otherwise specified, depressed (there      are  general medical factors as well as a functional history).  2. 296.23, major depressive disorder, single episode, severe.   AXIS II:  None.   AXIS III:  See general medical problems above.   AXIS IV:  General medical.   AXIS V:  Fifty-five.   The undersigned provided ego supportive psychotherapy.  The indications,  alternatives and adverse effects of Remeron were discussed with the  patient; the patient understands and would like to start Remeron.   The undersigned  did discuss the case with Dr. Timothy Lasso.   RECOMMENDATIONS:  1. Would start Remeron at 7.5 mg p.o. nightly as a test dose; if      tolerated, would then increase to 300 mg q.p.m. as tolerated.      Would monitor for excess sedation.  The Remeron can provide      beneficial acute side-effects of anti-insomnia, appetite      stimulation and anti-nausea, as well as anti acute feeling on edge.  2. If the Remeron is incomplete in efficacy, would later add Celexa.      Psychiatric outpatient followup can be obtained at one of the      clinics attached to [facility], [facility] or [facility].  3. Mrs. Sheek will call EMERGENCY services for any psychiatric      emergency symptoms.      Antonietta Breach, M.D.  Electronically Signed     JW/MEDQ  D:  04/22/2007  T:  04/23/2007  Job:  [id number]

## 2010-12-24 NOTE — Op Note (Signed)
[name], [name] NO.:  [id number]   MEDICAL RECORD NO.:  [id number]          PATIENT TYPE:  IPS   LOCATION:  4029                         FACILITY:  [facility]   PHYSICIAN:  Bernette Redbird, M.D.   DATE OF BIRTH:  Aug 09, 1931   DATE OF PROCEDURE:  11/29/2008  DATE OF DISCHARGE:                               OPERATIVE REPORT   PROCEDURE:  Upper endoscopy with biopsies.   INDICATIONS:  This is a 77 year old female recovering from a pelvic and  lumbar spine fracture as consequence of a fall, currently on the  rehabilitation unit with a fairly abrupt drop in hemoglobin recently,  having had a previously negative endoscopy by me within the past year or  so.  She does have chronic nausea, is chronically on 5 mg of prednisone,  is on an 81 mg aspirin, and also was on Lovenox in the hospital, which  has subsequently been stopped.  She was noted to be Hemoccult positive,  but never had a clinically overt GI bleed.   FINDINGS:  Extensive shallow duodenal ulceration without active  bleeding.   PROCEDURE:  The procedure was familiar to the patient who was brought  from her hospital room in a fasted state.  She had provided written  consent.  Sedation was fentanyl 75 mcg and Versed 6 mg IV without  clinical instability.  The Pentax adult video endoscope was passed under  direct vision.  The larynx and vocal cords looked normal.  The esophagus  was entered without significant difficulty and was normal in its  entirety other than a 1-2 cm hiatal hernia.  No reflux esophagitis,  Barrett esophagus, varices infection, neoplasia, Mallory-Weiss tear, or  esophageal ring were noted.   The stomach was entered.  It contained no blood or coffee-ground  material, and had normal mucosa without evidence of gastritis, erosions,  polyps, masses, or ulceration, including a retroflex view of the cardia.   The pylorus was a little bit tight.  The scope popped through it into  the duodenum.   The duodenal bulb was normal and specifically free of  peptic deformity.   However, on the anterosuperior aspect of the proximal second duodenum,  near the junction with the duodenal bulb, was an area of a strand of  adherent blood and on closer inspection, this was noted to be attached  to a fairly broad serpiginous area of superficial mucosal ulceration  with clean exudate.  There was no frank visible vessel and no active  bleeding.  In greatest dimension, this ulcer probably extended  approximately 3 cm or 4 cm x 2-3 cm.  I did not see ulcerations  elsewhere in the second duodenum.  A single biopsy was obtained from the  margin of this ulcer, looking for CMV.  Antral biopsies were obtained to  look for evidence of H. pylori infection.  Hemostasis was confirmed  endoscopically following the biopsy.  No hemostatic intervention was  performed on the ulcer itself since it was not actively bleeding and  there was no obvious discrete visible vessel to cauterize, inject, or  clip.  The scope was therefore removed from the patient.  She tolerated the  procedure well and there were no apparent complications.   IMPRESSION:  1. No active bleeding or blood in the stomach at the time of this      examination.  2. Extensive area of superficial ulceration in the proximal second      duodenum, which constitutes the most likely source of the patient's      recent bleeding.  No discrete stigma of hemorrhage, but small      strand of adherent blood suggesting that this was truly a bleeding      lesion.  3. The patient appears to be at low risk for rebleeding of any      significance.   RECOMMENDATIONS:  1. Continue twice-daily PPI therapy for the time being.  2. Add sucralfate to possibly augment healing.  3. Await pathology on biopsies, treating H. pylori if present and      looking for CMV (doubt).  4. If possible from the clinical standpoint, it would be ideal to hold      this patient's  aspirin for at least 2 weeks to help promote      healing.  I would then resume it only if it is strongly indicated.  5. The patient is on chronic low-dose prednisone.  It might be worth      reviewing whether continuation of that medication is necessary,      since it could be contributing to the patient's tendency to get      ulcerations, although the fact she has not had previous ulcers or      GI bleeding, would imply that it has not been causing recurrent      problems and perhaps stress of the patient's recent injury was      enough to tip her over into getting ulceration.  6. Follow up endoscopy is probably not needed, particularly if the      patient's aspirin and/or prednisone are able to be discontinued,      but it certainly would be appropriate if she remains Hemoccult      positive as an outpatient or has ongoing dyspeptic symptoms.           ______________________________  Bernette Redbird, M.D.     RB/MEDQ  D:  11/29/2008  T:  11/29/2008  Job:  [id number]   cc:   Ranelle Oyster, M.D.  Colleen Can. Deborah Chalk, M.D.  Gretta Cool, M.D.  Gwen Pounds, MD  Blenda Nicely Rosanna Randy, M.D.

## 2010-12-24 NOTE — Procedures (Signed)
CLINICAL HISTORY:  This is a 77 year old female, who was admitted for  altered mental status, hypertension, fall.  The patient is confused, not  following commands, metabolic encephalopathy versus TIA.   CURRENT MEDICATIONS:  Methylprednisolone, potassium, alprazolam,  Lanoxin, NovoLog, Synthroid, prednisone, Januvia.   TECHNICAL COMMENT:  An 18-channel EEG was performed based on standard  international 10-20 system.  Total recording time is 26.8 minutes,  17th  channel dedicated to EKG, which has demonstrated tachycardia of 132  beats per minute.   Majority of the tracing was obscured by frequent motion artifact, the  patient was described as awake; combative; moving head, arms; biting on  his restraint mittens.  The posterior background activity has mild  asymmetry, left side has higher amplitude of 50 microvoltage, but  slower, mixed delta and theta range activity.  In contrast, the right  side has 18 microvoltage, and mixed theta, alpha range activity.   There was frequent motion artifact, but there was no rhythmic discharge,  or epileptiform discharge.  Hyperventilation and photic stimulation was  not performed.  The patient was not able to achieve sleep during  recording.   CONCLUSION:  This is a marked abnormal, yet technically difficult study  because of frequent motion artifact.  There seemed to have asymmetry on  background activity, with the left side being slower, and but there was  no evidence of epileptiform discharge, further imaging study is  warranted.      Levert Feinstein, MD  Electronically Signed     HQ:IONG  D:  11/16/2008 18:42:18  T:  11/17/2008 03:21:01  Job #:  [id number]

## 2010-12-24 NOTE — Assessment & Plan Note (Signed)
OFFICE VISIT   [name], [name]  DOB:  1931/07/06                                       06/11/2009  EAVWU#:[id number]   The patient returns today for further evaluation of her chronic  varicosities in the left leg which are symptomatic.  She has been having  some pain, burning and numbness in the leg which waxes and wanes and  recently she said the symptoms have been less.  She also has some distal  swelling in the left leg.  She has no history of DVT, thrombophlebitis  or stasis ulcers.  She has not been able to wear the elastic compression  stockings because of discomfort related to the stockings but states that  her symptoms are not severe at this time.   PHYSICAL EXAMINATION:  Today, blood pressure is 160/80, heart rate 90,  respirations 18.  She is an elderly female who is in a wheelchair with a  back brace.  I visualized personally the saphenous system on the left  leg with the SonoSite, and she does have a small to medium size great  saphenous vein to the knee which has some reflux but not at the junction  and some varicosities medially over the great saphenous system and 1+  edema distally.  She has bulging varicosities in the great saphenous  system from the knee to the ankle medially with no evidence of any  hyperpigmentation or ulceration.  She has 2+ distal pulses.   I do not think laser ablation or stab phlebectomies are required in this  elderly lady who has back problems.  It would be very difficult to do a  procedure on her with her back, being unable to lay flat on a table.  Her symptoms today seem better, and I have recommended continued medical  management with no procedures being performed, and she and her son are  in agreement with that plan.  She will return to see [place] on a p.r.n.  basis.   Quita Skye Hart [place], M.D.  Electronically Signed   JDL/MEDQ  D:  06/11/2009  T:  06/12/2009  Job:  [id number]   cc:   Gwen Pounds, MD

## 2010-12-24 NOTE — Consult Note (Signed)
NAMEKIMESHA, [name] NO.:  [id number]   MEDICAL RECORD NO.:  [id number]          PATIENT TYPE:  INP   LOCATION:  4729                         FACILITY:  [facility]   PHYSICIAN:  Graylin Shiver, M.D.   DATE OF BIRTH:  10-24-1930   DATE OF CONSULTATION:  03/12/2007  DATE OF DISCHARGE:                                 CONSULTATION   REASON FOR CONSULTATION:  We were asked to see [name] today in  consultation by Dr. Creola Corn for failure to thrive and diarrhea.   HISTORY OF PRESENT ILLNESS:  This is a 77 year old female patient of Dr.  Timothy Lasso and Dr. Molly Maduro Buccini who was admitted for failure to thrive and  weight loss of 32 pounds in the past year.  She tells me that she has  mostly liquid, somewhat formed stools approximately four times per day  mostly associated with eating.  She states her diarrhea is much improved  over last November when she was hospitalized with severe diarrhea.  She  says her stools are occasionally dark.  She reports being very  dehydrated.  She was having mild abdominal pain in her epigastrium and  her hypogastric area.  She denies any vomiting.  Denies any red blood in  her stools.  She has been on Flagyl t.i.d. 500 mg since December 2007.  If she goes off the Flagyl, her diarrhea becomes severe.   PAST MEDICAL HISTORY:  1. Last colonoscopy was done by Dr. Dorena Cookey in December 2007.  He      found colitis in her cecum.  Biopsy showed active colitis with      fibrinopurulent exudate.  Her last endoscopy was done by Dr. Bernette Redbird in March 2007, which showed viral reflux and no source of      anemia.  2. Type 2 diabetes.  3. Chronic anemia.  4. Lymphocytic colitis.  5. Hypothyroidism.  6. Fibromyalgia.  7. Pancreatitis.  8. Microalbuminemia.  9. Gout.  10.Severe osteoporosis.  11.Cataracts.  12.History of pericarditis.  13.History of mild gastroparesis.  14.Status post bladder tack.   CURRENT MEDICATIONS:  Daily dose of  prednisone which she states that she  takes for gout, Zetia, calcium, Lanoxin, Synthroid, Neurontin, vitamin  D, Lasix, Januvia, Starlix, Flagyl, Protonix and a daily 81 mg aspirin.  She states that she has had no new medications in the last several  months.   ALLERGIES:  ALTACE, ACTOS, FOSAMAX, HYZAAR AND BONIVA.   REVIEW OF SYSTEMS:  Significant for weakness, fatigue, weight loss and  anorexia.   SOCIAL HISTORY:  Negative for alcohol or tobacco.  The patient is  widowed.  Her son helps care for her.   FAMILY HISTORY:  Negative for GI disease.   PHYSICAL EXAMINATION:  GENERAL:  She is alert and oriented in no  apparent distress, but she does speak very slowly and appears low on  energy.  HEART:  Regular rate and rhythm.  LUNGS:  Clear to auscultation anteriorly.  ABDOMEN:  Soft, nontender, nondistended with good bowel sounds.   LABORATORY DATA  AND X-RAY FINDINGS:  Leukocytosis at 16.7.  Hemoglobin  9.4, hematocrit 28.2, platelets are 331,000.  BUN 50 over a creatinine  of 1.84, potassium 4.4.  LFTs are normal.  Serum albumin 2.3, calcium  9.2.   ASSESSMENT:  Dr. Wandalee Ferdinand has seen and examined the patient.  His  impression is that this is a 77 year old female with multiple medical  problems.   1. A 32-pound weight loss, failure to thrive.  2. Abnormally large amount of bowel movements per day despite being on      Flagyl for the past 7-8 months.  3. Leukocytosis which could be steroid induced.   RECOMMENDATIONS:  We will do stool cultures including Cryptosporidium  and Giardia as well as fecal Lactoferrin and fecal fat.  Guaiac her  stools.  A trial of Questran, which I believe Dr. Timothy Lasso has already  ordered.  Consider trial of Xifaxan.  We will follow with you.   Thank you very much for this consultation.      Stephani Police, PA    ______________________________  Graylin Shiver, M.D.    MLY/MEDQ  D:  03/12/2007  T:  03/13/2007  Job:  [id number]

## 2010-12-24 NOTE — Op Note (Signed)
[name], [name]                ACCOUNT NO.:  [id number]   MEDICAL RECORD NO.:  [id number]          PATIENT TYPE:  OIB   LOCATION:  1337                         FACILITY:  [facility]   PHYSICIAN:  Anselm Pancoast. Weatherly, M.D.DATE OF BIRTH:  1931-06-10   DATE OF PROCEDURE:  06/19/2008  DATE OF DISCHARGE:                               OPERATIVE REPORT   PREOPERATIVE DIAGNOSES:  1. Chronic cholecystitis.  2. Diabetes mellitus.   POSTOPERATIVE DIAGNOSES:  1. Chronic cholecystitis.  2. Extensive adhesions around the umbilicus from an old hysterectomy      years earlier.   OPERATION:  Laparoscopic cholecystectomy with cholangiogram and also  lysis of adhesions in the umbilicus area laparoscopically, general  anesthesia.   SURGEONS:  Dr. Consuello Bossier assisted by Dr. Harden Mo.   HISTORY:  [name] is a 77 year old female who was referred to me by  Dr. Timothy Lasso and has had an extensive workup.  She is a diabetic and had  several hospitalizations, upper endoscopy ultrasound that showed small  stones.  She also had slow gastric emptying and they recommended that  she have her gallbladder removed.  She had previous surgery years ago  for a GYN hysterectomy and states that every time that she eats she  feels more nauseous.  She is losing weight and she was seen by Dr.  Merlyn Albert with problems with depression, but her weight is down  approximately 12 pounds this past year.  She had seen Dr. Matthias Hughs who  had done her endoscopy.  He found no evidence of any peptic ulcer  disease on repeat upper endoscopy and recommended that she have her  gallbladder removed.  I saw her in the office just recently and on  examination she had kind of a flat affect.  Her husband died at a very  young age.  She has 3 sons, one of her sons with liver, and it appears  that she just gets nauseated ever time she eats, not really typical  gallbladder attacks, but we are hopeful that the cholecystectomy will  relieve her symptoms.  Preoperatively, her liver function studies are  normal.  Her white count is 9900.  Her hemoglobin is 33.7.  She has had  no blood in stool and her glucose was 169 when it was checked last week.  Preoperatively, she was given 3 grams of Unasyn, she has PAS stockings,  and taken back the operative suite.   Induction of general anesthesia, endotracheal tube, oral tube to the  stomach.  The abdomen was prepped with Betadine solution and then she  was draped in a sterile manner.  A small incision was made below the  umbilicus.  The fascia was identified.  It was picked up and she does  have adhesions under the undersurface of the area just below the  umbilicus, but I could see sort of free space kind of going up  superiorly and with my finger did a little dissection there, and then  put in a pursestring suture.  The gallbladder was chronically very  distended with adhesions around it and I did the  upper 10-mm trocar in  the subxiphoid area and two lateral 5-mm trocars were placed in the  appropriate lateral position after anesthetizing the fascia of these  three.  The gallbladder was retracted upward and outward.  The adhesions  were carefully taken down.  The proximal portion of the gallbladder was  identified.  The cystic artery could be visually seen and I put 2 clips  proximally singly distally and then was able to encompass the junction  of the cystic duct gallbladder.  With this being encompassed, I went  ahead and divided the artery distal to the 2 clips and placed a clip on  the gallbladder at its junction with the gallbladder cystic duct, and  then made a little opening in the proximal cystic duct, put in the [place]  catheter, and held in place with a clip.  The extrahepatic biliary  system was normal with good flow into the duodenum and no evidence of  any dilatation.  The catheter was removed and I put 3 clips across the  proximal cystic duct, divided it, and  then freed up the gallbladder with  the hook electrocautery, and placed the gallbladder in an EndoCatch bag.  With switching the camera to the upper 10-mm port and looking down, I  was surprised to see there were extensive adhesions down just below the  umbilicus that I had come out just proximal to that and I elected to  take some of these adhesions down laterally.  At this point, I decided  to get my finger into the peritoneal cavity since it appeared that there  were more adhesions than I had originally appreciated and I could free  up some of them and then there were 2 loops of bowel kind of adherent to  the undersurface of the omentum.  With this being visualized, I put in  the [facility] cannula again and then worked very carefully dissecting down  the adhesions and dropping these 2 loops of small bowel away from the  omentum.  Then with everything freed, looking down, it appears that we  got adequate hemostasis and there were no other adhesions noted.  I then  went ahead and grasped the gallbladder, brought it up, and she did have  stones.  The pursestring suture at the umbilicus was closed with  additional figure-of-eight suture placed and the Wound VAC reinspected.  We had switched to a 15-degree scope with a 5-mm camera so we could  visualize it from the left upper quadrant when I was taking down the  adhesions and then switched back to the 10-mm straight scope.  The area  where the adhesions were taken down appeared to be doing nicely.  The  omentum where we had freed up, there was no active bleeding or small  bowel contents, etc.  Then, we irrigated and aspirated it, and then  switched the camera port - now is back up in the 10, and then withdrew  the 5-mm ports under direct vision.  I irrigated and aspirated the  irrigating fluid, and then released the carbon dioxide.  I did put a  fascia stitch in the fascia in the subxiphoid area where just because  she is so thin.  All of the  fascia port sites were then anesthetized and  then the subcutaneous wounds closed with 4-0 Vicryl, Benzoin and Steri-  Strips on the skin.   Hopefully, she is going to be a lot less nauseous now and arrangements  will be made  to definitely keep her overnight.  We will check a glucose  in the recovery room, resume her chronic medications, and hopefully she  will be ready for discharge in the a.m.           ______________________________  Anselm Pancoast. Zachery Dakins, M.D.     WJW/MEDQ  D:  06/19/2008  T:  06/20/2008  Job:  [id number]   cc:   Gwen Pounds, MD  Fax: [phone]   Bernette Redbird, M.D.  Fax: [phone]

## 2010-12-24 NOTE — Procedures (Signed)
LOWER EXTREMITY VENOUS REFLUX EXAM   INDICATION:  Left leg varicose vein with pain and swelling.   EXAM:  Using color-flow imaging and pulse Doppler spectral analysis, the  left common femoral, superficial femoral, popliteal, posterior tibial,  greater and lesser saphenous veins are evaluated.  There is no evidence  suggesting deep venous insufficiency in the left lower extremity.   The left saphenofemoral junction is competent.  The left GSV is not  competent with the caliber as described below.   The left proximal short saphenous vein demonstrates competency.   GSV Diameter (used if found to be incompetent only)                                            Right    Left  Proximal Greater Saphenous Vein           cm       0.54 cm  Proximal-to-mid-thigh                     cm       0.54 cm  Mid thigh                                 cm       0.37 cm  Mid-distal thigh                          cm       0.37 cm  Distal thigh                              cm       0.41 cm  Knee                                      cm       0.25 cm   IMPRESSION:  1. Left greater saphenous vein reflux is identified with the caliber      ranging from 0.25 cm to 0.54 cm knee to proximal greater saphenous      vein level.  2. The left greater saphenous vein is not aneurysmal.  3. The left greater saphenous vein is not tortuous.  4. The deep venous system is competent.  5. The left lesser saphenous vein is competent.  6. No evidence of DVT noted in the left leg.         ___________________________________________  Quita Skye. Hart [place], M.D.   MG/MEDQ  D:  03/06/2009  T:  03/07/2009  Job:  [id number]

## 2010-12-24 NOTE — Discharge Summary (Signed)
[name], [name] NO.:  [id number]   MEDICAL RECORD NO.:  [id number]          PATIENT TYPE:  INP   LOCATION:  5501                         FACILITY:  [facility]   PHYSICIAN:  Gwen Pounds, MD       DATE OF BIRTH:  05-Nov-1930   DATE OF ADMISSION:  04/19/2007  DATE OF DISCHARGE:  04/23/2007                               DISCHARGE SUMMARY   PRIMARY CARE Averie Hornbaker:  Dr. Creola Corn   DISCHARGE DIAGNOSES:  Include:  1. Altered mental state related to either a transient ischemic attack      versus medication side effect versus metabolic encephalopathy with      negative workup; resolved slowly on its own.  2. Major depression; started on Remeron per Dr. Jeanie Sewer.  3. Symptoms of altered mental state, word-finding difficulty and      underlying confusion in relationship to a severely elevated blood      pressure.  4. Hypertension.  5. Global weakness.  6. Type 2 diabetes.  7. Chronic diarrhea.  8. Systemic lupus erythematosus.  9. Chronic leukocytosis.  10.Anemia.  11.Hyperlipidemia.  12.Hypothyroidism.  13.Diabetic neuropathy.  14.Fibromyalgia.  15.History of pericarditis.  16.Microalbuminuria.  17.Gout.  18.Lymphocytic colitis.  19.Severe osteoporosis.  20.History of inferior ramus fracture in January of 2006.  21.History of gastritis.  22.Bilateral cataract removal.  23.Known ejection fraction of 50 to 60%.  24.History of recurrent dehydration and azotemia.  25.Status post bladder tack operation.  26.Polypectomy.  27.Vitamin D deficiency.   DISCHARGE MEDICATION LIST:  Includes:  1. Protonix 40 mg p.o. q.day.  2. Lasix 20 mg every other day.  3. Vitamin D 50,000 units weekly.  4. Synthroid 112 mcg p.o. q.day.  5. Lanoxin 0.125 mg p.o. q.day.  6. Calcium plus D q.daily.  7. Zetia 10 mg q.day.  8. Questran 4 gm in 8 ounces of water b.i.d.  9. Lantus 10 units subcutaneously daily.  10.Aspirin 81 q.day.  11.Prednisone 7.5 q.day.  12.Tylenol 650 mg  p.o. q.8 hours.  13.Remeron 7.5 mg p.o. q.h.s.   DISCHARGE PROCEDURES:  Include:  1. Medical management.  2. Consultation with Dr. Jeanie Sewer.  3. Cranial CT.  4. Brain MR.  5. EKG.  6. Carotid Dopplers.  7. A 2-D echocardiogram.   HISTORY OF PRESENT ILLNESS:  Briefly, this is a 77 year old female with  multiple medical problems, sent to the ED on April 19, 2007 with  confusion, blurred vision, difficulty getting words out.  Initial  evaluation includes an EKG, labs, urinalysis and cranial CT.  They were  all negative; exam was otherwise nonfocal.  He thought she could go home  with post followup, but unfortunately when he went to discharge her, she  had significant issues finding words and just was not herself and was  not right.  I was called for inpatient admission with a potential  diagnosis of CVA versus TIA.  We ordered an MRI to be done in the  emergency room and I saw her coming out of the OMR machine and she was  confused and cold.  Review of systems  otherwise was unremarkable.  Physical exam was otherwise unremarkable except she was only  intermittently oriented.  She was shaking with chills.  Her speech was  intact.  She had diffuse weakness without anything focal and she had  some word-finding difficulties.  We admitted her for further evaluation  and treatment.   HOSPITAL COURSE:  We admitted [name] on April 19, 2007 with  confusion, altered mental state and word-finding difficulties.  She  answered questions.  She was pleasant, but ends up looking at me with a  blank stare.  Cranial CT and MRI done in the emergency room were both  negative.  Her underlying symptoms were compatible to TIA, medication  side effects, anxiety with psychological overlay versus some sort of  metabolic encephalopathy.  She denied any other underlying issues.  The  only issue was in the emergency room her blood pressure did get up to  202/99.  We went ahead and checked her  carotids and 2-D echo to see if  there is any underlying focal areas of thrombus for some sort of embolic  phenomenon.  The 2-D echo was complete and it showed an EF of 60%, no  diagnostic evidence of left ventricular regional wall motion  abnormalities; the left ventricular wall thickness is moderately  increased; the aortic valve thickness was mildly increased; there was  only mild-to-moderate mitral annular calcification, otherwise there is  no underlying blood clots or thrombus.  The carotid Dopplers showed  vertebral flow intergrade, no significant right ICA stenosis and no  significant left ICA stenosis noted.  Her Reglan was put on hold, some  other GI medications were put on hold, aspirin was added, and we debated  whether to get neurology involved or just watch her.  We also elected to  work on her blood pressure and because her blood pressure was elevated,  we did start her on Norvasc and slowly titrated the medication up.  We  also got physical and occupational therapy to see her.  At the same  time, we went ahead and controlled the diabetes.  Her GI issues were  stable.  She was kept on underlying prednisone.  Her leukocytosis was  stable.  Her anemia was not an issue and she remained on only Zetia and  Questran secondary to underlying inability to tolerate other  medications.   As noted above, she was seen by speech therapy, physical therapy and  occupational therapy throughout the hospital course.  Their  recommendations are easily followed throughout the chart.  The next day  on April 21, 2007, her affect was still a little off for her.  She  was clear, slow and deliberate in her speech, but she just was not quite  right and she was still having trouble finding some words.  Her  neurologic exam remained nonfocal so we elected just continue to follow  and try to get the blood pressure under a bit better control.  I had Dr.  Jeanie Sewer see her who thought that she had  some underlying depression  and started her on Remeron.  I saw her on April 22, 2007; she was  clearer and I elected to discharge her with close followup as an  outpatient and to follow her creatinine because her creatinine did go up  to 1.6.  Other underlying metabolic issues like ammonia, lactic acid,  cultures and a whole bunch of other lab tests eventually came back  negative and unrevealing.  I got a  call later on on September 11 that  her blood pressure had bottomed out to 70/41 on the Norvasc; this was  stopped and we gave her some fluids and watched her overnight.  Her  blood pressure the following day was 118/62; she was markedly back to  her baseline, mental status; thus, was good; she did not remember the  several days.  I guess the other potential diagnosis is transient global  amnesia-type issue.  The orthostasis that she had the day before was  secondary to the blood pressure medicines.  We elected to discharge her  off blood pressure medications with close followup and I would easily  put her on blood pressure medicines as an outpatient.  Mentally, she was  fine.  I agree with the Remeron that Dr. Jeanie Sewer wanted to put her on,  gave her a prescription for it and I felt that I could adjust her  diabetes as an outpatient if her blood sugars remain elevated.  In the  24 hours prior to admission, her blood sugars ranged from 95 to 235.   Ms. Yassmin Binegar was discharged in stable condition on April 23, 2007 with the idea that we closely follow her up, follow her mental  state, see if she is taking and agrees with the Remeron and sleeping  well, and adjust her medications for the diabetes and blood pressure  issues as needed.      Gwen Pounds, MD  Electronically Signed     JMR/MEDQ  D:  05/19/2007  T:  05/19/2007  Job:  [id number]

## 2010-12-24 NOTE — Op Note (Signed)
[name], [name]                ACCOUNT NO.:  [id number]   MEDICAL RECORD NO.:  [id number]          PATIENT TYPE:  AMB   LOCATION:  ENDO                         FACILITY:  [facility]   PHYSICIAN:  Bernette Redbird, M.D.   DATE OF BIRTH:  Dec 21, 1930   DATE OF PROCEDURE:  05/22/2008  DATE OF DISCHARGE:                               OPERATIVE REPORT   PROCEDURE:  Upper endoscopy.   INDICATIONS:  A 77 year old female with a 72-month history of persistent  nausea symptoms in the setting of a roughly 15-year history of diabetes  and some diabetic neuropathy, but with a recent gastric emptying scan  being just minimally abnormal.  The patient has not responded to a  recent trial of sucralfate.   FINDINGS:  Normal exam.   PROCEDURE:  The nature, purpose, risks of the procedure were familiar to  the patient from prior examination and she provided written consent.  Sedation was fentanyl 75 mcg and Versed 8 mg IV without arrhythmias or  desaturation.  The Pentax video endoscope was passed under direct  vision.  The vocal cords were not well seen but the esophagus was  readily entered and was endoscopically normal without evidence of free  reflux, reflux esophagitis, Barrett esophagus, varices infection or  neoplasia.  No ring or stricture was present.  A small 1-2 cm hiatal  hernia was seen.   The stomach was entered and notably contained no bile or residual food,  just a very small amount of clear fluid, perhaps 5-10 mL.  The gastric  mucosa was normal without any evidence of erythema to suggest bile  reflux gastritis, and no erosions, ulcers, polyps or masses.  A  retroflexed view of the cardia was normal.   The pylorus was a little bit hypertonic but otherwise normal, not  stenotic.  When relaxed, the scope passed easily into a normal-appearing  duodenal bulb and second duodenum.   The scope was then removed from the patient.  She tolerated the  procedure well and there were no apparent  complications.   IMPRESSION:  Normal endoscopy, without source of nausea endoscopically  evident.   PLAN:  1. Abdominal ultrasound.  2. Office follow up in the near future to go over the pros and cons of      a trial of metoclopramide and/or erythromycin therapy.          ______________________________  Bernette Redbird, M.D.    RB/MEDQ  D:  05/22/2008  T:  05/23/2008  Job:  [id number]   cc:   Gwen Pounds, MD

## 2010-12-27 ENCOUNTER — Encounter (HOSPITAL_COMMUNITY): Payer: Self-pay | Admitting: Radiology

## 2010-12-27 ENCOUNTER — Emergency Department (HOSPITAL_COMMUNITY): Payer: Medicare Other

## 2010-12-27 ENCOUNTER — Emergency Department (HOSPITAL_COMMUNITY)
Admission: EM | Admit: 2010-12-27 | Discharge: 2010-12-27 | Disposition: A | Discharge status: Home / Self Care | Payer: Medicare Other | Attending: Emergency Medicine | Admitting: Emergency Medicine

## 2010-12-27 DIAGNOSIS — E1142 Type 2 diabetes mellitus with diabetic polyneuropathy: Secondary | ICD-10-CM | POA: Insufficient documentation

## 2010-12-27 DIAGNOSIS — X58XXXA Exposure to other specified factors, initial encounter: Secondary | ICD-10-CM | POA: Insufficient documentation

## 2010-12-27 DIAGNOSIS — E039 Hypothyroidism, unspecified: Secondary | ICD-10-CM | POA: Insufficient documentation

## 2010-12-27 DIAGNOSIS — S32509A Unspecified fracture of unspecified pubis, initial encounter for closed fracture: Secondary | ICD-10-CM | POA: Insufficient documentation

## 2010-12-27 DIAGNOSIS — E1149 Type 2 diabetes mellitus with other diabetic neurological complication: Secondary | ICD-10-CM | POA: Insufficient documentation

## 2010-12-27 DIAGNOSIS — M25559 Pain in unspecified hip: Secondary | ICD-10-CM | POA: Insufficient documentation

## 2010-12-27 DIAGNOSIS — H3530 Unspecified macular degeneration: Secondary | ICD-10-CM | POA: Insufficient documentation

## 2010-12-27 DIAGNOSIS — R0602 Shortness of breath: Secondary | ICD-10-CM | POA: Insufficient documentation

## 2010-12-27 DIAGNOSIS — I10 Essential (primary) hypertension: Secondary | ICD-10-CM | POA: Insufficient documentation

## 2010-12-27 HISTORY — DX: Essential (primary) hypertension: I10

## 2010-12-27 LAB — CBC
HCT: 34 % — ABNORMAL LOW (ref 36.0–46.0)
MCH: 28.9 pg (ref 26.0–34.0)
MCV: 92.6 fL (ref 78.0–100.0)
RDW: 14.7 % (ref 11.5–15.5)
WBC: 12.1 10*3/uL — ABNORMAL HIGH (ref 4.0–10.5)

## 2010-12-27 LAB — URINALYSIS, ROUTINE W REFLEX MICROSCOPIC
Glucose, UA: NEGATIVE mg/dL
Leukocytes, UA: NEGATIVE
Specific Gravity, Urine: 1.015 (ref 1.005–1.030)
pH: 7 (ref 5.0–8.0)

## 2010-12-27 LAB — BASIC METABOLIC PANEL
BUN: 23 mg/dL (ref 6–23)
Chloride: 99 mEq/L (ref 96–112)
Creatinine, Ser: 1.06 mg/dL (ref 0.4–1.2)
GFR calc non Af Amer: 50 mL/min — ABNORMAL LOW (ref >60)
Glucose, Bld: 129 mg/dL — ABNORMAL HIGH (ref 70–99)
Potassium: 3.9 mEq/L (ref 3.5–5.1)

## 2010-12-27 LAB — DIFFERENTIAL
Eosinophils Absolute: 0.3 10*3/uL (ref 0.0–0.7)
Eosinophils Relative: 3 % (ref 0–5)
Lymphocytes Relative: 17 % (ref 12–46)
Lymphs Abs: 2.1 10*3/uL (ref 0.7–4.0)
Monocytes Relative: 11 % (ref 3–12)

## 2010-12-27 LAB — URINE MICROSCOPIC-ADD ON

## 2010-12-27 MED ORDER — IOHEXOL 300 MG/ML  SOLN
80.0000 mL | Freq: Once | INTRAMUSCULAR | Status: AC | PRN
  Administered 2010-12-27: 80 mL via INTRAVENOUS

## 2010-12-27 NOTE — Discharge Summary (Signed)
[name], [name]                ACCOUNT NO.:  [id number]   MEDICAL RECORD NO.:  [id number]          PATIENT TYPE:  INP   LOCATION:  5704                         FACILITY:  [facility]   PHYSICIAN:  Gwen Pounds, MD       DATE OF BIRTH:  02/09/1931   DATE OF ADMISSION:  07/25/2006  DATE OF DISCHARGE:  07/31/2006                         DISCHARGE SUMMARY - REFERRING   DISCHARGE DIAGNOSES:  1. Admitted for diarrhea and dehydration.  2. Acute colitis from questionable source.  3. Diabetes mellitus type 2.  4. Dehydration, improved.  5. Acute renal insufficiency, improved.  6. Nonspecific autoimmune disorder.  7. Chronic anemia.  8. Hypokalemia, now replete.  9. Deconditioning.  10.Known osteoporosis.  11.Known polyneuropathy.  12.Hyperlipidemia.  13.Hypothyroidism.  14.History of lymphocytic colitis.   DISCHARGE MEDICATIONS INCLUDES:  1. Lopid 600 mg p.o. b.i.d. to restart in two weeks.  2. Zetia 10 mg daily.  3. Vitamin D 50,000 units twice a week.  4. Prednisone 5 mg one-half daily x1 week then discontinue; and      continue on Entocort 3 mg three tablets p.o. daily.  5. Lanoxin 0.125 mg p.o. daily.  6. Indocin 50 mg p.o. daily as needed.  7. Protonix 40 mg p.o. daily.  8. Aranesp injections as directed.  9. Synthroid 112 mcg p.o. daily.  10.K-Dur 40 mg p.o. daily x3 weeks.  11.Flagyl 250 mg p.o. t.i.d. through December 26.  12.Cholestyramine 4 g in 8 ounces of water daily.  13.Neurontin 400 mg p.o. t.i.d.  14.Iron sulfate 325 mg p.o. t.i.d.  15.Januvia 50 mg p.o. q.a.m.  16.She is to HOLD the Lasix until increased edema or after I instruct      her when I see her back.   DISCHARGE PROCEDURES:  Colonoscopy dated July 27, 2006 per Dr. Madilyn Fireman  which showed visible colitis in the cecum and sigmoid colon, unclear  whether inflammatory bowel disease or pseudomembranous colitis.  The  plan was to await all biopsy results and aspirate.  Biopsy results came  back as active  colitis associated with a fibrinopurulent exudate.   HISTORY OF PRESENT ILLNESS:  Briefly, [name] is a 77 year old  female with known history of lymphocytic colitis; and associated with  Dr. Matthias Hughs who is her gastroenterologist.  She presented for medical  attention on July 25, 2006 with eight to nine bowel movements per  day, watery and diarrhea.  She had already been placed by Dr. Matthias Hughs on  Entocort for her underlying colitis.  She also had seen Dr. Matthias Hughs two  days prior to admission.  He scheduled her back to be seen on January 2.  Her symptoms got worse with worsening of the diarrhea where she felt  weak and tired with nausea, diarrhea, and GI issues as well as a 15-  pound weight loss over a two-to-three month period of time.  She was  seen and evaluated in the emergency room on the 15th; and was noted to  be dehydrated with acute renal failure with a creatinine of 2.1.  It was  felt that she needed to be  admitted to the hospital secondary to  dehydration, and azotemia, due to active colitis.   HOSPITAL COURSE:  [name] was admitted on July 25, 2006  with nausea, diarrhea, and extremely active colitis leading to  dehydration and azotemia.  On admission she was given IV fluids.  Dr.  Matthias Hughs was consulted.  With the IV fluids, her BUN and creatinine  started improving; over the next couple of days her BUN was down to 37  and creatinine was down to 1.2. GI elected to do a colonoscopy on her  later on July 27, 2006.  It was determined that this was colitis  with purulent material and biopsies were obtained; and the possibility  of C. difficile colitis or pseudo membranous colitis was entertained.  Flagyl was added.  She remained on her Entocort and steroids.  The  steroids were eventually increased; and this obviously increased her  blood sugars; and made her diabetes more difficult to handle.  She was  placed on sliding scale insulin and increasing  doses of insulin.   Over the next couple of days, December 18th and 19th through the 21st to  continue to get IV fluids.  Her blood sugars improved, her BUN and  creatinine went down to her baseline of 12 and 1.0 respectively.  Her  hemoglobin dropped into the 8 to 9 range; and we started tapering her  steroids and back to her home Entocort dose.  She was given Aranesp in  the hospital and iron for her anemia.   Her diarrhea come down dramatically; and by December 19 she only had one  episode.  All other labs remained appropriate.  All other vitals  remained appropriate; and it was determined that she had colitis of  unknown source, potentially pseudomembranous colitis; and she would  continue with a full course of her Flagyl.  She was going to continue on  the steroids; and she could be followed up appropriately as an  outpatient.   For her diabetes she remained under decent control with all the changes  that we made and more we will follow this up appropriately as an  outpatient.   For her acute renal insufficiency and dehydration -- obviously this  improved to her baseline.   For the acute hospital stay and the recent issues of the diarrhea -- she  was left with failure to thrive and deconditioning.  We watched her  through the 21st and felt that she was appropriate for discharge at that  time.  We will keep an eye on her overall conditioning as an outpatient;  and have her follow up relatively quick.   AFTERCARE FOLLOW UP INSTRUCTIONS:  She will follow up with Dr. Matthias Hughs  and myself as deemed appropriate; and we will continue managing these  difficult conditions as an outpatient.      Gwen Pounds, MD  Electronically Signed     JMR/MEDQ  D:  01/13/2007  T:  01/13/2007  Job:  [id number]   cc:   Bernette Redbird, M.D.  Dr. Madilyn Fireman

## 2010-12-27 NOTE — Consult Note (Signed)
[name], [name]                ACCOUNT NO.:  [id number]   MEDICAL RECORD NO.:  [id number]          PATIENT TYPE:  INP   LOCATION:  5733                         FACILITY:  [facility]   PHYSICIAN:  Shirley Friar, MDDATE OF BIRTH:  July 07, 1931   DATE OF CONSULTATION:  DATE OF DISCHARGE:                                   CONSULTATION   HISTORY OF PRESENT ILLNESS:  This consult is completed at the request of Dr.  Creola Corn in reference to the patient's diarrhea.  The patient is a 77-year-  old white female admitted on July 25, 2005, due to over a week of watery  diarrhea described as two or three watery stools per day with left-sided  abdominal pain.  She was recently discharged from the hospital on July 06, 2005, for possible diverticulitis.  She had negative C. diff toxin x2 at  that time.  She was in interim since discharge, and prior to this admission  she reports being on Cipro and Flagyl for 10 days for her possible  diverticulitis.  She denies any fevers, chills, nausea, vomiting.  On  presentation to the emergency room yesterday, she was hemodynamically  stable, but did have a leukocytosis with a white blood cell count of 21,000  with no bands.  Since admission, she reports resolution of her diarrhea and  states that she is not currently having any bowel movements.  Stool studies  have not been completed since the patient has not moved her bowels since  being admitted.  Her white blood cell count has improved to 15,000, and she  is tolerating the diet that was ordered.  She does continue to complain of  occasional left-sided abdominal pain.  Of note, she had a CAT scan on  July 06, 2005, without contrast that did not show any diverticulitis.   PAST MEDICAL HISTORY:  1.  Diabetes mellitus.  2.  GERD.  3.  Hypothyroidism.  4.  Hyperlipidemia.  5.  Fibromyalgia.  6.  History of urinary tract infection.  7.  Anemia.  8.  History of pericarditis.  9.   Osteoporosis.   MEDICATIONS:  1.  Digoxin 0.125 mg daily.  2.  Zetia 10 mg daily.  3.  Glyburide 2.5 mg b.i.d.  4.  Neurontin 300 mg t.i.d.  5.  Insulin.  6.  Synthroid.  7.  Protonix.  8.  Prednisone.   PHYSICAL EXAMINATION:  VITAL SIGNS:  Temperature 99, pulse 72, blood  pressure 116/59, respirations 20, O2 saturation 93% on room air.  GENERAL:  Alert, in no acute distress.  HEENT:  Nonicteric sclerae.  CHEST:  Clear to auscultation bilaterally.  CARDIOVASCULAR:  Regular rate and rhythm without murmurs.  ABDOMEN:  Soft, mild tenderness in left lower quadrant, otherwise nontender.  No guarding, no rebound, nondistended, positive bowel sounds.  EXTREMITIES:  No edema.   LABORATORY DATA:  White blood cell count 15.3 (21.5 on admission),  hemoglobin 10.3, hematocrit 30.4, platelet count 315.   ESR 120, sodium 138, potassium 4.4, chloride 107, CO2 of 23, BUN 46,  creatinine 1.3, glucose 189.  Bilirubin 0.5, ALP 61, AST 14, ALT 11, total  protein 6.8, albumin 2.5, calcium 8.9.   ASSESSMENT:  A 77 year old white female with approximately one week of  watery diarrhea suspicious for C. diff colitis, especially in light of  recent antibiotics.  Currently, her diarrhea has resolved, she is not moving  her bowels at all, but tolerating her food without any nausea or vomiting.   Agree with aggressive hydration, checking stool studies, fecal white blood  cells and C. diff toxin.  No indication to do a sigmoidoscopy at this time.  Would hold off on giving Flagyl unless the patient's C. diff toxin is  positive.  Imodium p.r.n. is okay for her.  We will continue to follow up.  Thank you for this consult.      Shirley Friar, MD  Electronically Signed     VCS/MEDQ  D:  07/26/2005  T:  07/27/2005  Job:  [id number]   cc:   Gwen Pounds, MD  Fax: [phone]

## 2010-12-27 NOTE — H&P (Signed)
NAMEGESSICA, [name] NO.:  [id number]   MEDICAL RECORD NO.:  [id number]          PATIENT TYPE:  INP   LOCATION:  6525                         FACILITY:  [facility]   PHYSICIAN:  [name], MD DATE OF BIRTH:  [birth year]   DATE OF ADMISSION:  10/19/2005  DATE OF DISCHARGE:                                HISTORY & PHYSICAL   [name] is a 77 year old white woman who is admitted to [facility] for further evaluation of nausea.   The patient presented to the emergency department with a 2-day history of  continuous nausea. This has resulted in a reduced appetite. There was no  abdominal pain, vomiting, diarrhea, hematemesis, melena, or hematochezia,  nor has there been any fever or chills. The patient has noted no chest pain,  tightness, heaviness, pressure, or squeezing. She did note some mild dyspnea  on Friday but that resolved. She has also noted some mild ankle edema over  the course of the weekend. The patient reports no other symptoms. There is  no cough, chest congestion, or sputum production. There is no dysuria,  pyuria, or frequency.   It is not clear what past cardiac history the patient has. There is some  mention of congestive heart failure and she is on Lasix and digoxin, but the  documentation for congestive heart failure is not at hand. There is no  history of coronary artery disease. There is no history of arrhythmia.   The patient has a number of other medical problems including lupus, insulin-  dependent diabetes mellitus, dyslipidemia, and hypothyroidism.   MEDICATIONS:  Lasix, Synthroid, prednisone, Lopid, digoxin, indomethacin,  Zetia, Protonix, Glucovance.   ALLERGIES:  None.   OPERATIONS:  Hysterectomy and drainage of a perirectal abscess.   SOCIAL HISTORY:  The patient neither smokes cigarettes nor drinks alcohol.  She lives with her son. She is widowed.   FAMILY HISTORY:  There is no family history of cardiac  disease.   REVIEW OF SYSTEMS:  Reveals no new problems related to her head, eyes, ears,  nose, mouth, throat, lungs, gastrointestinal system, genitourinary system,  or extremities. There is no history of neurologic or psychiatric disorder.  There is no history of fever, chills, or weight loss.   PHYSICAL EXAMINATION:  VITAL SIGNS:  Blood pressure 147/91, pulse 93 and  regular, respirations 18, temperature 98.3, pulse oximetry 95% on room air.  GENERAL:  The patient was an elderly white woman in no discomfort. She was  alert, oriented, appropriate, and responsive.  HEENT:  Head, eyes, nose, and mouth were normal. The neck was without  thyromegaly or adenopathy. Carotid pulses were palpable bilaterally and  without bruits.  CARDIAC:  Revealed a normal S1 and S2. There was no S3, S4, murmur, rub, or  click. Cardiac rhythm is regular. No chest wall tenderness was noted.  LUNGS:  Clear.  ABDOMEN:  Soft and nontender. There was no mass, hepatosplenomegaly, bruit,  distention, rebound, guarding, or rigidity. Bowel sounds were normal.  BREAST AND PELVIC:  Examinations were not performed as they were not  pertinent to  the reason for acute care hospitalization.  EXTREMITIES:  Without edema, deviation, or deformity. Radial and dorsalis  pedis pulses were palpable bilaterally.  NEUROLOGIC:  Brief screening neurologic survey was unremarkable.   The electrocardiogram was notable for incomplete right bundle-branch block.  There was a nonspecific ST abnormality. There were no repolarization changes  specific for ischemia or infarction. The chest radiograph, according to the  radiologist, demonstrated cardiomegaly with no evidence of edema. Potassium  is 3.9, BUN 18, and creatinine 1.3. The initial set of cardiac markers  revealed a myoglobin of 81, CK-MB less than 1.0, and troponin less than  0.05. White count was 13.7 with a hemoglobin of 11.1 and hematocrit of 32.8.  BNP was 30. The remaining  studies were pending at the time of this  dictation.   IMPRESSION:  1.  Nausea, leukocytosis.  2.  Rule out congestive heart failure. There was no congestive heart failure      today by physical examination, chest radiograph, or BNP.  3.  Insulin-dependent diabetes mellitus.  4.  Dyslipidemia.  5.  Hypothyroidism.  6.  Lupus.  7.  Anemia.  8.  Hypertension.   PLAN:  1.  Telemetry.  2.  Serial cardiac enzymes.  3.  Amylase and lipase.  4.  Three-way abdominal x-ray.  5.  Echocardiogram.  6.  Zofran.  7.  Further measures per Dr. Deborah Chalk.      [name], MD  Electronically Signed     MSC/MEDQ  D:  10/19/2005  T:  10/20/2005  Job:  [id number]   cc:   [name], MD  Fax: [phone]   Colleen Can. Deborah Chalk, M.D.  Fax: [phone]

## 2010-12-27 NOTE — Op Note (Signed)
NAMEDEMAYA, HARDGE                ACCOUNT NO.:  [id number]   MEDICAL RECORD NO.:  [id number]          PATIENT TYPE:  INP   LOCATION:  6525                         FACILITY:  [facility]   PHYSICIAN:  Bernette Redbird, M.D.   DATE OF BIRTH:  December 07, 1930   DATE OF PROCEDURE:  10/21/2005  DATE OF DISCHARGE:  10/21/2005                                 OPERATIVE REPORT   PROCEDURE:  Upper endoscopy.   INDICATIONS:  This is a 77 year old female with nausea and anemia.   FINDINGS:  A Small amount of bile reflux.   DESCRIPTION OF PROCEDURE:  The nature, purpose, and risks of the procedure  were familiar to the patient who provided written consent. Sedation was  fentanyl 50 mcg and Versed 4 mg IV without arrhythmias or desaturation. The  Olympus small-caliber adult video endoscope was passed readily under direct  vision. The hypopharyngeal structure seemed a little boggy; and it was not  really possible see the vocal cords discretely.   The esophagus was entered without difficulty and had normal mucosa with some  slightly prominent veins; but no varices, no reflux esophagitis, no  Barrett's esophagus, no neoplasia, inflammation, ring or stricture, or  hiatal hernia.   The stomach was entered and noted to contain a small-to-moderate bilious  residual which was suctioned up. The gastric mucosa was not frankly  erythematous to suggest bile reflux gastritis. In the antrum, however, were  a couple of tiny foci of erythema consistent with aspirin-induced gastric  inflammation. These changes were nonerosive and minimal in amount. No  ulcers, polyps, or masses were observed. Retroflexed viewing of the cardia  was normal. The pylorus, duodenal bulb, and second duodenum looked normal  including the mucosal pattern of the villi in the second duodenum.   The scope was removed from the patient. No biopsies were obtained. She  tolerated the procedure well and there were no apparent complications.   IMPRESSION:  1.  Bile reflux, which may readily account for the patient's nausea      (787.01).  2.  No source of anemia endoscopically evident.   PLAN:  The patient seems to have gotten initial benefit from use of  sucralfate over the past 24 hours. We will plan to continue that medicine  and see how she does, but if the nausea continues to be a problem, a gastric  emptying scan would probably be our next step. Regarding the anemia, I would  consider further Hemoccult testing (was heme-positive in the hospital while  having diarrhea) before embarking on a small-bowel capsule endoscopy.           ______________________________  Bernette Redbird, M.D.     RB/MEDQ  D:  10/21/2005  T:  10/22/2005  Job:  [id number]   cc:   Gwen Pounds, MD  Fax: [phone]   Colleen Can. Deborah Chalk, M.D.  Fax: [phone]   Areatha Keas, M.D.  Fax: [phone]

## 2010-12-27 NOTE — Consult Note (Signed)
[name], [name]                ACCOUNT NO.:  [id number]   MEDICAL RECORD NO.:  [id number]          PATIENT TYPE:  INP   LOCATION:  5704                         FACILITY:  [facility]   PHYSICIAN:  Bernette Redbird, M.D.   DATE OF BIRTH:  16-Sep-1930   DATE OF CONSULTATION:  07/26/2006  DATE OF DISCHARGE:                                 CONSULTATION   GASTROENTEROLOGY CONSULTATION.:  Dr. Tina Griffiths, covering for Dr.  Timothy Lasso, asked me to see this 77 year old female because of diarrhea.   Mr. Rajkumar is well known to me.  She has a history of significant  diarrhea for which she underwent an unprepped colonoscopy about 9 months  ago, with biopsies at that time showing lymphocytic colitis.  When she  came back in the office to go over those results, however, diarrhea had  resolved spontaneously, so she was never treated for it and did well  until the past month or two when the diarrhea started up again, very  severe, both day and night.  Note that the patient is maintained on  prednisone, and it was thus thought, especially with the nocturnal  diarrhea, that perhaps this was diabetic diarrhea instead of the  lymphocytic colitis.  Accordingly, she was given a trial of clonidine  for about a week and a gradually escalating dose up to 0.2 mg b.i.d.,  without any evident improvement.  At that time, we decided to try  Entocort for treatment of lymphocytic colitis.  Note that a C difficile  specimen had come back negative as an outpatient.  However, just a day  or so after starting the Entocort, she became so weak that she could  hardly walk, and she felt very dehydrated, so she was directed to the  emergency room where Dr. Wylene Simmer admitted her.   She has had a little bit of right upper quadrant discomfort, no fevers  or chills, no rectal bleeding.   Interestingly, since admission, the diarrhea seems to have slowed down.  She is having several bowel movements during the night and again during  the day, but these are really small-volume squirts rather than profuse  diarrhea, so it is definitely improved compared to say a week ago.   PAST MEDICAL HISTORY:  Of this is primarily pertinent for:  1. Type 2 diabetes.  2. Fibromyalgia.  3. Polyneuropathy.  4. Nonspecific autoimmune disorder.   PREVIOUS OPERATIONS:  Include a remote appendectomy.   OUTPATIENT MEDICATIONS:  Lopid, Zetia, Lasix, prednisone, Lanoxin,  Protonix,  Indocin, Synthroid and, just started, Entocort 9 mg daily.   PHYSICAL EXAMINATION:  GENERAL:  A pleasant but somewhat elderly-  appearing female in no acute distress.  CHEST:  Clear to auscultation.  HEART: Normal.  HEENT: The patient is anicteric without evident pallor.  ABDOMEN: Soft with active bowel sounds.  No guarding, mass or  tenderness.   IMPRESSION:  I think that we are seeing lymphocytic colitis with an  early response to Entocort therapy.   PLAN:  1. Continue Entocort.  2. Colonoscopy tomorrow. Risks reviewed.  We will rebiopsy the  intestine to see if there has been a significant change from      approximately 9 months ago.           ______________________________  Bernette Redbird, M.D.     RB/MEDQ  D:  07/26/2006  T:  07/26/2006  Job:  [id number]   cc:   Gwen Pounds, MD

## 2010-12-27 NOTE — Discharge Summary (Signed)
NAMEGLYNN, FREAS NO.:  [id number]   MEDICAL RECORD NO.:  [id number]          PATIENT TYPE:  INP   LOCATION:  5522                         FACILITY:  [facility]   PHYSICIAN:  Gwen Pounds, MD       DATE OF BIRTH:  1930-10-22   DATE OF ADMISSION:  06/28/2005  DATE OF DISCHARGE:  07/04/2005                                 DISCHARGE SUMMARY   PRIMARY CARE Jazlyne Gauger:  Gwen Pounds, MD.   GASTROENTEROLOGIST:  Bernette Redbird, M.D.   RHEUMATOLOGIST:  Areatha Keas, M.D.   DISCHARGE DIAGNOSES:  1.  Altered mental status secondary to dehydration.  2.  Urinary tract infection.  3.  Dehydration azotemia with BUN and creatinine up to 73 and 2.1.  4.  Severe hypercalcemia with calcium level of 13.  5.  Anemia with negative GI workup per Dr. Matthias Hughs in November 2003 now heme-      positive and hemoglobin 9.0.  6.  Presumed diverticulitis.  7.  Nonspecific autoimmune disorder and followup by Dr. Phylliss Bob on chronic      prednisone.  8.  Admitted with severe hypoglycemia with blood sugars in the 20s and 30s.  9.  Longstanding type 2 diabetes mellitus.  10. Gastroesophageal reflux disease.  11. History of ejection fraction of 50-60%.  12. Hypothyroidism.  13. Hyperlipidemia.  14. Hypertriglyceridemia.  15. Diabetic neuropathy.  16. Fibromyalgia.  17. History of pericarditis.  18. History of microalbuminuria.  19. History of elevated uric acid.  20. History of hypokalemia.  21. History of severe osteoporosis with a right inferior pubic ramus stress      fracture in January 2006.  22. History of severe gastritis.  23. History of peripheral visual field defect resolved in November 2003.  24. History of right eye cataract surgery.  25. Status post bladder tack surgery.  26. [facility].   DISCHARGE PROCEDURES:  Include abdominal x-ray on 07/02/2005 no acute  abdominal process by plain film.  Cranial CT on 06/28/2005 no acute  intracranial findings, chronic microvascular white matter  disease.   MEDICATIONS:  1.  Miacalcin nasal spray 1 spray daily alternating nostrils.  2.  Os-Cal D 1-2 daily.  3.  Lanoxin 0.125 mg p.o. daily.  4.  Neurontin 300 mg p.o. 3 times per day as needed.  5.  Vicodin as needed.  6.  Prednisone 7.5 mg p.o. q.a.m.  7.  Vitamin D 50,000 units once every other week instead of twice per week.  8.  Gemfibrozil 600 mg p.o. b.i.d.  9.  Iron sulfate 325 mg b.i.d.  10. Amaryl 4 mg p.o. daily for diabetes.  11. She is to HOLD her Lasix and furosemide until I see her next week.  12. She is to DISCONTINUE Microzide, Glucovance, and indomethacin.   HISTORY OF PRESENT ILLNESS:  Briefly, Ms. Delpha Pennebaker is a 77 year old  female with type 2 diabetes, fibromyalgia, hypothyroid, osteoporosis who  presented to the emergency room with 1 week of not feeling well, poor p.o.  intake, anorexia.  In the emergency room she was diagnosed with severe  hypoglycemia requiring D50 and a D5 drip, severe dehydration with azotemia  with elevated BUN and creatinine.  Urinary tract infection with  leukocytosis, hypercalcemia, and altered mental state.  Cranial CT, chest x-  ray and laboratory evaluation was undertaken which lead to the above  diagnoses.  The cranial CT was normal.  Dr. Wylene Simmer was called for inpatient  admission.   HOSPITAL COURSE:  Ms. Xanthe Couillard was admitted through the emergency  department with a urinary tract infection, leukocytosis, dehydration,  hypercalcemia, hypoglycemia, anemia, and altered mental state.   For the hypoglycemia she did receive D50 boluses followed by a D5 drip.  Her  Glucovance was placed on hold.  After she was adequately hydrated, the  Glucovance was allowed to get out of her body, and she started eating and  drinking.  Once again, her blood sugars began to rise as she no longer  needed the drip and instead of the Glucovance being added, Amaryl was added.  We kept her off the Glucophage because of the elevated BUN and  creatinine.  She seems to be doing okay on the Amaryl. Unfortunately with eating and  prednisone her blood sugars do go up into the 200s and 300s for which she  has required some insulin in the hospital.  We will follow this closely as  an outpatient and titrate up the Amaryl as needed.  She may need insulin or  we may be able to place her back on the Glucovance the further away she is  from this acute illness.   Her dehydration and azotemia required aggressive IV fluids for 2-3 days.  Her BUN and creatinine on admission were 73 and 2.1 respectively; and on  discharge she had a BUN of 9 and a creatinine of 1.2.  Lasix, Microzide, and  Glucovance are on hold for the recent azotemia.   She had a urinary tract infection and was placed on Cipro.  The urinary  cultures were inconclusive.  She will finish a full course of that.  She did  have hypercalcemia which corrected very easily with fluid.  We held the  vitamin D initially and, now, we are only going to use one 50,000 unit  tablet every other week instead of 4 times every 2 weeks.  We will watch her  calcium very closely, this probably went up with her acute illness.  I will  also need to check vitamin D levels, which one is on order here and still  pending, just to make sure that we are not overdoing it with this  medication.   Her altered mental state improved dramatically as we adjusted her  medications and gave her fluid back.   For her nondescript autoimmune disease with questionable lupus her  sedimentation rates have recently been low at 100.  She sees Dr. Phylliss Bob as an  outpatient.  She has been on __________ 10 mg of prednisone per day, lately  he has her on 10 mg.  Her sedimentation rate, checked in the hospital was  112.  On admission she was placed on 5 mg prednisone because we did not now  exactly how much she was taking.  She did go maybe 2 or 3 days without taking this medicine, which may have also affected her altered  mental state.  Cortisol levels were not checked on admission.  She will be discharged on  7.5 mg and adjustments to this dose will be made per Dr. Renaldo Reel discretion.   She was noted  to be anemic on admission.  Her hemoglobin was 10.4, it is  typically about 10.5 as an outpatient, and we had been thinking about  starting her on Aranesp or Procrit injections.  She has had a negative  colonoscopy and endoscopy per Dr. Matthias Hughs approximately 3 years ago.  Throughout her hospital stay her hemoglobin trended down from 10.4 to 9.0 to  8.9.  She did get 1 dose f Aranesp.  She also developed left lower quadrant  pain and worsening __________ .  For this reason I consulted  gastroenterology who saw her on November 22 and felt that this might be  diverticulitis.  He did not feel that any endoscopy was indicated.  A head  CT scan would have been an option, but we were afraid of her recently  elevated creatinine.  An MRI was also considered.  After starting the  Cipro/Flagyl she has done well and on July 04, 2005 her abdominal pain  was resolved.  She had three soft bowel movements yesterday and none today;  and we are in a hold on any further GI evaluation unless her symptoms  return.  They said that they would be happy to see her as an outpatient if  need me.  The last two fecal occult blood tests were negative.  Her last CBC  showed a white count of 14.6, hemoglobin 9.0, platelet count 304.   LABS:  Reticulocyte, iron, folic acid, B12, and ferritin were all within  normal limits.  Her last CBC and BMET are listed in the body of the  discharge summary.  Uric acid levels have been 0.4.  Sedimentation rate is  112.  TSH 0.983, Digoxin level 1.1.   By July 04, 2005 her abdominal pain was better.  She had three, loose,  soft bowel movements yesterday, but none today.  Her blood sugars are still  fluctuating. She has been up walking in the hall.  She is without any  orthostasis symptoms and she  is being treated for presumed diverticulitis.  Her exam is back to her baseline.  Her mental status is back to her  baseline; and it is deemed that she can be safely discharged to home with  careful and close outpatient followup.   FOLLOWUP:  She will followup with me in one week probably Monday or  Wednesday with repeat labs.  She will call if she gets short of breath or  has increasing lower extremity edema, or gets high blood sugars, or any  questions.  She will followup with Dr. Matthias Hughs or Dr. Bosie Clos depending on  what happens with her blood counts and abdominal pain.  She will also finish  her antibiotics and followup with Dr. Phylliss Bob regarding her elevated  sedimentation rate and what we need to do.  Fortunately she does not have  any aches or pains that she is dealing with at this current time.      Gwen Pounds, MD  Electronically Signed     JMR/MEDQ  D:  07/04/2005  T:  07/04/2005  Job:  [id number]  cc:   Gwen Pounds, MD  Fax: [phone]   Bernette Redbird, M.D.  Fax: [phone]   Feliberto Gottron. Turner Daniels, M.D.  Fax: [phone]

## 2010-12-27 NOTE — Op Note (Signed)
   NAME:  [name], [name]                          ACCOUNT NO.:  [id number]   MEDICAL RECORD NO.:  [id number]                   PATIENT TYPE:  AMB   LOCATION:  ENDO                                 FACILITY:  [facility]   PHYSICIAN:  Bernette Redbird, M.D.                DATE OF BIRTH:  1930-11-09   DATE OF PROCEDURE:  DATE OF DISCHARGE:                                 OPERATIVE REPORT   PROCEDURE:  Colonoscopy with biopsy.   INDICATIONS FOR PROCEDURE:  Nonspecific bilateral lower quadrant pain in a  77 year old female who also needs colon cancer screening.   FINDINGS:  Small sessile polyp, biopsied at 16 cm from the external anal  opening.   PROCEDURE:  The patient provided written consent for a procedure which was  performed immediately following her upper endoscopy.  Total sedation for the  two procedures was Fentanyl 100 mcg and Versed 10 mg IV without arrhythmias  or desaturation.   The Olympus adjustable tension pediatric video colonoscope was advanced to  the cecum without much difficulty and pull back was then performed.  The  quality of the prep was very good and it is felt that all areas were well  seen.   Apart from the 3 mm semi pedunculated polyp at about 16 cm from the external  anal opening, this was a normal examination.  Specifically, there was no  source of lower abdominal pain identified.  Note that the terminal ileum was  not entered.   Specifically, there was no evidence of colitis or diverticular disease.  No  large polyps or cancer was seen.  Retroflexion was not attempted in the  rectum since the rectal ampulla was quite small, but antegrade viewing  disclosed no distal rectal lesions.  Re-inspection of the rectum was  unremarkable.   The patient tolerated the procedure well and there were no apparent  complications.   IMPRESSION:  1. Normal colonoscopy except for small sessile polyp removed as described     above via cold biopsy technique.  2. No source of  nonspecific lower abdominal pain colonoscopically evident.    PLAN:  Await pathology on the polyp.  Monitor clinically for evolution of  patient's abdominal pain which, fortunately, seems to be getting better as  of the present time.                                               Bernette Redbird, M.D.    RB/MEDQ  D:  07/06/2002  T:  07/06/2002  Job:  [id number]   cc:   Gwen Pounds, M.D.  [address]  [place]  [place] [address]  Fax: [phone]

## 2010-12-27 NOTE — Discharge Summary (Signed)
NAME:  [name], [name] NO.:  [id number]   MEDICAL RECORD NO.:  [id number]                   PATIENT TYPE:  INP   LOCATION:  5735                                 FACILITY:  [facility]   PHYSICIAN:  Gwen Pounds, M.D.                 DATE OF BIRTH:  1931-06-11   DATE OF ADMISSION:  01/24/2004  DATE OF DISCHARGE:  01/26/2004                                 DISCHARGE SUMMARY   DISCHARGE DIAGNOSES:  1. Erosive antral gastritis.  2. Severe erosive duodenitis.  3. Gastroesophageal reflux disease.  4. Dehydration.  5. Failure to thrive.  6. Abdominal pain.  7. Diabetes mellitus type 2.  8. Hypothyroidism.  9. Hyperlipidemia.  10.      Diabetic neuropathy.  11.      Fibromyalgia.  12.      History of pericarditis.  13.      History of pancreatitis.  14.      History of systemic lupus references.  15.      Myoglobinuria.  16.      Gout.  17.      Status post bladder tacking.  18.      Right and left cataract surgeries.  19.      Polypectomy.  20.      History of enterocele defect, which has resolved.   DISCHARGE MEDICATIONS:  1. Protonix 40 mg p.o. b.i.d.  2. Reglan 5 mg p.o. b.i.d. for 30 days only.  3. Synthroid 112 q.d. and half on Sunday.  4. Lasix 40 mg q.d.  5. __________ 10 mg q.d.  6. Microzide 12.5 mg q.o.d.  7. Tylenol.  8. Vicodin.  9. Glyburide 2.5 (restart as blood sugars go up and stay greater than 100).   PROCEDURES:  Air contrast upper GI series with KUB; with findings consistent  with erosive antral gastritis and erosive duodenitis.  Small sliding hiatal  hernia with mild to moderate distal esophageal dysmotility, and episodes of  spontaneous gastroesophageal reflux.  No esophageal stricture noted.   HISTORY OF PRESENT ILLNESS:  [name] is a 77 year old female with  multiple medical problems, admitted through my office on January 24, 2004.  She  had several vague complaints, majority being located in the abdomen with  abdominal bloating, abdominal pains, nausea, vomiting and weight loss and  failure to thrive -- for which she was now dehydrated.  Workup had included  stopping Glucophage.  CT scans of the chest, abdomen and pelvis and  abdominal ultrasound were all relatively unremarkable.  She did have a six  pound weight loss.  She was weak and becoming frail.  Of note, she had a 103  fever at one of her ER visits, and was diagnosed with urinary tract  infection and treated with Bactrim.   HOSPITAL COURSE:  [name] was admitted from my office with multiple  vague complaints; felt to  be GI in nature, as well as dehydration and  failure to thrive.  She actually had new onset of mild anemia.  On admission  we hydrated her.  We got a new set of labs.  We asked Dr. Matthias Hughs to  consult.  Of note, the patient has had an EGD and a colonoscopy in the last  couple of years;  both were relatively unremarkable in the past.   Differential diagnosis at the time was esophageal stricture, peptic ulcer  disease, esophagitis and gastritis, etc.  She was empirically started on IV  proton pump inhibition and IV Reglan.   By the following morning, on that therapy, she was actually feeling better.  Dr. Matthias Hughs had seen her and scheduled her for an air contrast upper GI  series -- this showed that she had esophagitis and duodenitis, as well as  esophageal dysmotility.  Her medications were adjusted even further.  On  January 26, 2004 she was medically stable for discharge.   DISPOSITION:  She will be discharged home to follow up with me as an  outpatient.  I will leave it up to Dr. Matthias Hughs as to whether he wants to do  an outpatient endoscopy, but I am sure this will depend on how well she  responds to therapy.  Her hypertension remained control during the hospital  stay.  Her dehydration was improved with fluids.  Her diabetes was well  controlled, and actually she did hypoglycemia for which we will hold on her   anti-diabetic medications and adjust as an outpatient accordingly.   DISCHARGE INSTRUCTIONS:  After careful follow-up instructions, she will  follow-up with me in two to three weeks or earlier with worsening symptoms.  She will avoid spicy foods.  She will increase activity as her health  improves.                                                Gwen Pounds, M.D.    JMR/MEDQ  D:  01/26/2004  T:  01/26/2004  Job:  [id number]   cc:   Bernette Redbird, M.D.  [address] [place]  [place], [place] [address]  Fax: [phone]

## 2010-12-27 NOTE — Consult Note (Signed)
NAME:  [name], NABOR NO.:  [id number]   MEDICAL RECORD NO.:  [id number]                   PATIENT TYPE:  INP   LOCATION:  5735                                 FACILITY:  [facility]   PHYSICIAN:  Bernette Redbird, M.D.                DATE OF BIRTH:  08-Jul-1931   DATE OF CONSULTATION:  01/24/2004  DATE OF DISCHARGE:                                   CONSULTATION   GASTROENTEROLOGY CONSULTATION:   REASON FOR CONSULTATION:  Dr. Timothy Lasso asked me to see this 77 year old female  because of various abdominal symptoms.   HISTORY OF PRESENT ILLNESS:  [name] is known to me from prior evaluation  and in fact had negative endoscopy and colonoscopy by me a couple of years  ago.  Current issues relevant to the GI tract include anemia (Hemoccult  positive on admission), dysphagia and, by her report, nausea and bloating.  These are not normal symptoms for her.  For the past several weeks, she has  also had a sense that food that she eats stops in the high epigastric area  and seems to put pressure from below up on her lungs making it difficult for  her to breathe.  This does not sound like esophageal dysphagia in that it  does not sound as though the patient has trouble actually swallowing or  consuming the food.  She does report a poor appetite and according to Dr.  Timothy Lasso, when she was seen in the office today, she had a documented 6-pound  weight loss.  She was somewhat dehydrated staggering around and so forth.   Interestingly, the patient has not had any significant change in bowel  habits.  She states she does have regular bowel movements pretty much each  day with just occasional use of a laxative.  She has not noticed any rectal  bleeding or black stools.  From the upper tract perspective, there has been  no heartburn.   Previous GI evaluation includes a negative CT of the chest, abdomen and  pelvis and a basically negative ultrasound except for some biliary  ductal  dilatation, with normal liver chemistries, according to discussion with Dr.  Timothy Lasso.   Because of several ER visits in the recent past and the ongoing symptoms,  she was admitted out of the office today and I was asked to assist with her  evaluation.   PAST MEDICAL HISTORY:  1. Allergy to ALTACE,  HYZAAR, and ACTOS.  2. Current medications:  Synthroid, Lasix, __________ , glyburide, daily     baby aspirin, Darvocet, and OxyContin (note that the patient is not on     PPI therapy).  3. Operations include hysterectomy.  4. Medical illnesses include a history of diabetes with neuropathy,     hypercholesterolemia, pancreatitis and apparently she also had a colon     polyp removed.  Other problems include fibromyalgia and a history  of gout     and a history of hypothyroidism, past pancreatitis, past pericarditis.   FAMILY HISTORY AND SOCIAL HISTORY:  Not obtained.   REVIEW OF SYSTEMS:  Per HPI.   PHYSICAL EXAMINATION:  Pertinent for significant upper abdominal tympany.  [name] is a well-preserved 77 year old female in no evident distress and  appearing neither anxious nor depressed at the time of my evaluation.  She  is anicteric and without frank pallor, the chest is clear to auscultation  and heart sounds are normal without gallops, rubs, murmurs, clicks, or  arrhythmias.  The abdomen is nondistended, slightly adipose and has quite a  bit of upper abdominal tympany but no succussion splash, no organomegaly,  guarding, mass, or tenderness.  Rectal was Hemoccult negative by Dr. Timothy Lasso  on admission today and was not repeated.   LABORATORIES:  Hemoglobin 10.9, white count 8200, amylase normal at 50,  liver chemistries essentially normal as of yesterday in the office.   IMPRESSION:  Subjective upper abdominal symptoms.  This could be atypical  reflux or, more likely, a motility disorder, either impairment of receptive  relaxation of the proximal stomach or some sort of  functional bloating  condition.   PLAN:  1. Empiric institution of PPI therapy to help take atypical acid reflux out     of the equation.  2. Upper GI series with barium tablet to look for anatomic abnormalities     such as tumors (doubt), esophageal strictures, significant hiatal hernia,     reflux, etc.  A significant hiatal hernia would presumably have been     noted on the abdominal CT scan so that is unlikely to be present.  The     patient sounds more mechanical than mucosal in character and with heme-     negative stool and previous endoscopy, I think the yield of endoscopic     evaluation at this time, as compared to radiographic evaluation would be     relatively low and therefore, radiographic evaluation may be comparable     or even better.  Further management will depend on the findings of that     study.                                               Bernette Redbird, M.D.    RB/MEDQ  D:  01/24/2004  T:  01/25/2004  Job:  [id number]   cc:   Gwen Pounds, M.D.  [address]  [place]  [place] [address]  Fax: [phone]

## 2010-12-27 NOTE — H&P (Signed)
NAME:  [name], [name]                          ACCOUNT NO.:  [id number]   MEDICAL RECORD NO.:  [id number]                   PATIENT TYPE:  INP   LOCATION:                                       FACILITY:  [facility]   PHYSICIAN:  Gwen Pounds, M.D.                 DATE OF BIRTH:  1930-09-22   DATE OF ADMISSION:  01/24/2004  DATE OF DISCHARGE:                                HISTORY & PHYSICAL   GASTROENTEROLOGIST:  Bernette Redbird, M.D.   CHIEF COMPLAINT:  Abdominal bloating, abdominal pain, shortness of breath,  nausea and vomiting, weight loss, failure to thrive, and dehydration.   HISTORY OF PRESENT ILLNESS:  This is a 77 year old female with type 2  diabetes, hypothyroid, diabetes neuropathy, fibromyalgia, history of  pancreatitis, history of gout, and colonoscopy and upper endoscopy by Dr.  Matthias Hughs in November 2003, which the upper endoscopy was normal and the lower  endoscopy showed a polyp and she is status post polypectomy.  She has been  getting sick, of late.  Symptoms started at the end of May and have included  muscle aches, abdominal pain, discomfort, bloating, nausea and vomiting,  shortness of breath, cough, congestion, early satiety, dysphagia, and other  GI symptoms.  She has stopped the Glucophage and has had several  laboratories drawn, all pretty much normal.  She has gone to the ED three  times and had CT scans of the chest, abdomen, and pelvis, all negative.  An  abdominal ultrasound showed no gallstones, but upper limit of normal,  common bile duct at 8 mm and borderline splenomegaly.  She is being admitted  today with a 6-pound weight loss, weakness, failure to thrive, intractable  nausea, dehydration, for further evaluation and treatment, recent 103.0  fever, and diagnosis of UTI, per patient, and is currently finishing three  more doses of Bactrim.   PAST MEDICAL HISTORY:  1. Diabetes mellitus, type 2.  2. Hypothyroid.  3. Hyperlipidemia.  4. Diabetic  neuropathy.  5. Fibromyalgia.  6. History of pericarditis.  7. History of pancreatitis.  8. History of vague SLE references in the chart.  9. Microalbuminuria.  10.      Gout.  11.      Ejection fraction 50-60%.  12.      Status post bladder tack operation.  13.      Right and left cataract operation.  14.      Polypectomy.  15.      History of visual field defect which has now resolved.   ALLERGIES:  ALTACE, HYZAAR, ACTOS.   MEDICATIONS:  1. Synthroid 112 daily and half on Sunday.  2. Lasix 40 daily.  3. Zetia 10 daily.  4. Microzide 12.5 q.o.d.  5. Tylenol.  6. Darvocet.  7. Glyburide 2.5 daily.   SOCIAL HISTORY:  She is widowed with four children, two grandchildren,  retired, no alcohol, no smoking.  FAMILY HISTORY:  Cerebrovascular accident, congestive heart failure,  diabetes mellitus type 2.   REVIEW OF SYSTEMS:  Please see HPI.  No chest pain, no fevers.  She has a  positive review of systems otherwise, but nothing clear and obvious as to  what is causing the above illnesses.   PHYSICAL EXAMINATION:  VITAL SIGNS:  Temperature is afebrile, heart rate  100, blood pressure 114/70, weight 146 pounds which is down 6 pounds from 1  week.  GENERAL:  Weak, frail.  SKIN:  Tinting.  HEENT:  Oropharynx is dry.  NECK:  No JVD.  PULMONARY:  Clear to auscultation bilaterally.  CARDIAC:  Regular.  ABDOMEN:  Hyperactive bowel sounds, tender throughout.  No  hepatosplenomegaly noted.  RECTAL:  Minimal stool in the vault, heme negative.  EXTREMITIES:  No edema.   LABORATORY DATA:  Recent laboratory data shows BNP 32.1, amylase 50, LFTs on  June 14 were fine, except for AST 40, albumin 2.9, lipase 29, white blood  cell count 8.2, hemoglobin 10.9.  Sodium 131, potassium 4.3, chloride 103,  bicarb 21, BUN 18, creatinine 1.6, glucose 141.   ASSESSMENT:  This is a 77 year old female with multiple medical problems  being admitted with dehydration, failure to thrive, nausea and  vomiting,  multiple gastrointestinal issues and weakness, anemia, hemoglobin 12.2 in  May, now 10.9, and mild renal insufficiency secondary to dehydration.   PLAN:  1. Admit.  2. Re-hydrate.  3. Follow laboratories.  4. Gastrointestinal consult.  5. Rule out esophageal stricture, peptic ulcer disease, esophagitis versus     gastritis, and consider repeat EGD.  We will start IV PPI for now.  6. Potential diabetic gastroparesis.  We will start IV Reglan.  7. Controlled diabetes.  8. Hold Lasix.  9. Finish antibiotics for urinary tract infection.  10.      Anemia workup with laboratories.  11.      Check cultures, then follow up.                                                Gwen Pounds, M.D.    JMR/MEDQ  D:  01/24/2004  T:  01/24/2004  Job:  [id number]

## 2010-12-27 NOTE — Discharge Summary (Signed)
[name], [name]                ACCOUNT NO.:  [id number]   MEDICAL RECORD NO.:  [id number]          PATIENT TYPE:  IPS   LOCATION:  4029                         FACILITY:  [facility]   PHYSICIAN:  Ranelle Oyster, M.D.DATE OF BIRTH:  02-Mar-1931   DATE OF ADMISSION:  11/22/2008  DATE OF DISCHARGE:  12/01/2008                               DISCHARGE SUMMARY   DISCHARGE DIAGNOSIS:  1. Hypertensive encephalopathy, resolved.  2. Right pelvic fracture and L2 fractures.  3. Acute blood loss anemia, secondary to peptic ulcer disease.  4. Hypertension.  5. Diabetes mellitus type 2.  6. Anxiety.  7. Autoimmune disorder.  8. Question of lupus.   HISTORY OF PRESENT ILLNESS:  [name] is a 77 year old female with  history of diabetes mellitus, hypertension, fall with back and hip pain.  On November 14, 2008, she was evaluated in ED where she developed acute  mental status changes with speech difficulties and confusion.  BP was  noted to be 131/71 at admission, did rise up to 197/113 to 163/105, and  the patient was confused.  MRI and MRA of brain showed no acute changes.  The patient was noted to have a similar episode in March 18, 2009.  Neuro was consulted for input and felt the patient with hypertensive  encephalopathy.  EEG done revealed mild abnormality.  No seizure  activity.  The patient currently has had resolution of her confusion.  Therapies initiated, however, the patient has been limited secondary to  hip and back pain.  X-rays of right pelvis on November 10, 2008, revealed  healing right superior and inferior pubic rami fractures that appeared  subacute with degenerative changes at lumbar and S1 joints.  Dr. Jillyn Hidden  was consulted for input and felt the patient could be weightbearing as  tolerated with followup x-rays to be done in 2 weeks.  Therapies were  initiated and the patient was noted to have impairments in safety with  fear of falling with limited mobility.  The patient was  evaluated by  rehab and felt that she would benefit from inpatient rehab stay.   PAST MEDICAL HISTORY:  Significant for:  1. 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  2. Hypertension.  3. Dyslipidemia.  4. Fibromyalgia.  5. Severe osteoporosis with vitamin D deficiency.  6. LAP CHOLE in November 2009.  7. Chronic leukocytosis.  8. History of gastritis.  9. Gout.  10.Hypothyroid.  11.History of pericarditis.  12.History of depression and anxiety.  13.Autoimmune disorder.  14.Question of lupus.  15.History of acute mental status changes.  16.?TIA versus encephalopathy.  17.Anemia of bladder tack.  18.History of lymphocytic colitis.   ALLERGIES:  1. ALTACE.  2. ACTOS.  3. DIOVAN.  4. HYZAAR.  5. BONIVA.  6. NEURONTIN.   FAMILY HISTORY:  Positive for CVA and diabetes.   SOCIAL HISTORY:  The patient is widowed, lives with 2 sons who work  alternating shifts and provide 24-hour supervision.  Does not use any  tobacco or alcohol.  Functional history.  The patient was independent  prior to admission.  She does not drive.  FUNCTIONAL STATUS:  The patient is min assist for grooming and for  standing balance.  Min-to-mod assist with transfers and min-guard assist  ambulating 20 feet with rolling walker.   PHYSICAL EXAMINATION:  VITAL SIGNS:  blood pressure 125/79, pulse 88,  respiratory rate 20, temperature 96.9.  GENERAL:  The patient is pleasant well-nourished and well-developed  female noted to be anxious, however, in no acute distress.  HEENT:  Pupils equal, round, reactive to light.  Hearing intact.  Oral  mucosa reveals missing teeth with borderline dentition, moist and pink  mucosa noted.  NECK:  Supple without JVD or lymphadenopathy.  CHEST:  Clear to auscultation bilaterally without wheezes or rales  occasional rhonchi noted.  Heart shows regular rate and rhythm without  murmurs or gallops.  ABDOMEN:  Soft and nontender with positive bowel sounds.  She is  tender  in hypogastric area as well as flank in addition to low back area.  She  has excellent ecchymosis on area of left hip as well as left groin.  No  ecchymosis along pelvic region or stomach.  NEUROLOGIC:  Cranial nerves II through XII by intact.  Reflexes 2+ to  1+.  Sensation decreased distally in bilateral lower extremities.  Strength is generally 4/5 and 5/5 in upper extremities.  Lower extremity  strength is 1-2/5 and left hip 2/5 with 2/5 right hip, left knee at 2-  3/5 and right knee at 3/5.  Bilateral ankle movement is 4/5.  Judgment,  orientation, and memory are appropriate.  Affect is flat, however, noted  to be with anxious.   HOSPITAL COURSE:  [name] was admitted to rehab on November 22, 2008, for inpatient therapies to consist of PT, OT at least 3 hours 5  days a week.  Past admission physiatrist, rehab RN, and therapy team  have worked together to provide customized collaborative  interdisciplinary care.  The patient was noted to be anemic at time of  admission with hemoglobin at 7.3 and she was transfused 2 units packed  red blood cells on day of admission.  Stool guaiacs x3 were ordered to  rule out any occult cause of GI bleed.  Additionally, CT of abdomen and  pelvis was ordered to rule out any acute bleeding source.  CT abdomen  and pelvis done showed no retroperitoneal bleed.  She was noted to have  further compression of L2 vertebral body with retropulsion and spinal  stenosis on CT.  CT of L-spine was also done for followup showing  further compression of L2 vertebral body with retropulsion and spinal  stenosis as well as multifactorial spinal stenosis at L4-L5.  The  patient was placed on bedrest until evaluated by Neurosurgery.   Labs done on a.m. of November 23, 2008, revealed hemoglobin 8.9, hematocrit  25.5, white count 10.8, and platelets 262.  Check of lytes revealed  sodium 136, potassium 3.8, chloride 102, CO2 of 25, BUN 19, creatinine  0.86,  and glucose 89.  Dr. Franky Macho evaluated the patient and recommended  LSO for lumbar support.  He felt the patient was not a surgical  candidate and would need to avail bracing prior to any attempts at  surgical fixation.  The patient currently without neurological symptoms  and did not require surgical intervention.  The patient's pain has been  off an issue during this stay.  She was started on fentanyl patch with  dose slowly titrated upward for better pain management.  The patient's  blood pressures  were monitored on b.i.d. basis.  These have ranged from  120s to occasional high in 150s systolic, and 60s to 70s diastolic.  The  patient's CBG was monitored with a.c. nightly checks and overall blood  sugars have been well controlled ranging from 110s to occasional high in  150s.  The patient was noted to have recurrent anemia with hemoglobin  dropping to 7.3 on November 24, 2008.  She was transfused 2 units of packed  red blood cells with a serial check of her H and H.  She was also  additionally noted to have positive stool guaiacs.  On November 28, 2008,  the patient was noted to have one episode of left upper quadrant pain  with vomiting.  [facility] was consulted for input.  They recommended  upper endoscopy, which was done on November 29, 2008.  Upper endoscopy  revealed the patient without any active bleeding or blood, however,  extensive area of superficial ulceration noted in proximal second  duodenum, which was slightly constituting to the patient's source of  recent bleeding.  This had the patient at low risk for rebleeding.  They  recommended increasing PPIs to twice a day as well as adding sucralfate  to help possibly augment healing.  They recommended holding the  patient's aspirin and question whether prednisone would could be  discontinued.  The patient's aspirin was placed on hold currently.  She  continues on prednisone and is to follow up with primary MD regarding  need for  prednisone and for input regarding resumption of aspirin in  future.  Pathology biopsies done of duodenum revealed no evidence of  Helicobacter pylori virus, cytopathic changes, adenomatous changes,  dysphagia, or malignancy.  Stomach and antrum biopsy revealed reactive  gastropathy.   During the patient's stay in rehab physiatrist, rehab RN, and therapy  have worked together to provide customized collaborative  interdisciplinary care.  Rehab RN has been working with the patient in  bowel and bladder training by toileting patient q.4 h. to prevent  incontinence.  They have also been working with the patient and pain  management with premedicating the patient prior to therapy to help  improve her participation in therapy.  Initially, the patient is voiding  was monitored to check for retention and she was noted be voiding  without any difficulty.  The patient also reported some issues with  constipation, however, refused to use any laxatives.  She has been using  prune juice and this has been effective in treating her symptoms.  At  time of admission, the patient was noted to be impaired by pain as well  as noted to have high level of anxiety.  She was min assist for  toileting, min assist for transfers, and min assist for ambulating 25  feet with max encouragement, extremely fearful of falling and with  tendency to grasp for things.  With adjustment of the patient's pain  meds as well as premedicating the patient prior to therapies, the  patient has progressed along.  She was a modified independent to  supervision for all mobility.  She was able to ambulate 160 feet with a  rolling walker with supervision, able to navigate 6 stairs with one rail  with supervision.  The patient has been working in upper extremity  exercise group as well as to help increase upper extremity strengthening  as well as lower extremity exercise group to help with generalized  strengthening of muscles with LAQs,  mini squats as  well as marching in  place.  Family education was done with son who can provide supervision.  The patient requires supervision due to her issues with cognition as  well as for safety awareness.  OT eval at time of admission revealed the  patient at min assist for self care.  She was limited by decrease in  bilateral upper extremity strength as well as poor activity tolerance.  She was also noted to have issues with anxiety initially.  Therapeutic  activity groups have been working with the patient to address rolling  walker safety, activity tolerance, dynamic as well as static standing  balance as well as sitting to standing transition.  They have also been  working with donning and doffing brace with both the patient and family.  The patient needs assist with perianal care, past toileting, as well as  assist to don and doff LSO at the edge of bed.  The patient is overall  supervision for the ADL task as well as toileting.  A 24-hour  supervision is recommended due to safety concerns as well as need to  help assist with LSO in dawning in order to maintain back precautions.  Speech therapy evaluation at time of admission revealed the patient with  anxiety as well as reports of decline in memory capacity with sons  managing all house, work, medications as well as finances.  They felt,  the patient was essentially at baseline and no speech therapy was needed  during this stay.  The patient will continue to receive further followup  home health, PT, OT, and RN by [facility] past discharge on  December 01, 2008, the patient is discharged to home.  CBC at time of  discharge reveals hemoglobin 9.9, hematocrit 29.0, white count 9.5, and  platelets 258.   DISCHARGE MEDICATIONS:  1. Prednisone 5 mg a day.  2. Senokot-S 2 p.o. at bedtime p.r.n.  3. Apresoline 25 mg q.8 h.  4. MiraLax 17 g in 8 ounces p.r.n. constipation.  5. Miacalcin nasal spray one squirt daily.  6. Januvia  100 mg a day.  7. Synthroid 112 mcg a day.  8. Norco 5/325 one to two q.6-8 h. p.r.n. moderate-to-severe pain.  9. Tylenol 1-2 q.6-8 h. p.r.n. mild pain.  10.Lidocaine patches 2-3 patches on back on 7:00 a.m., off 7:00 p.m.      daily.  11.Os-Cal plus D one p.o. b.i.d.  12.Carafate 1 g p.o. a.c. nightly.  13.Fentanyl patch 25 mcg an hour change q.72 h.  14.Nexium 40 mg b.i.d.   DIET:  Diabetic diet.   ACTIVITY LEVEL:  Twenty-four hour supervision.   DISCHARGE INSTRUCTIONS:  No alcohol or smoking.  No driving.  No  strenuous activity.  No bending, twisting, reaching upward or lifting  anything over 5 pounds.   SPECIAL INSTRUCTIONS:  Do not use aspirin until cleared by Dr. Timothy Lasso or  Dr. Matthias Hughs.  [facility] to provide PT, OT, and RN.   FOLLOWUP:  1. The patient to follow up with Dr. Franky Macho for check in 2-3 weeks.  2. Follow up with Dr. Timothy Lasso for routine check as well as CBC in 2-3      weeks.  3. Follow up with Dr. Riley Kill as needed.  4. Follow up with Dr. Matthias Hughs in 3-4 weeks.      Greg Cutter, P.A.      Ranelle Oyster, M.D.  Electronically Signed    PP/MEDQ  D:  12/05/2008  T:  12/06/2008  Job:  [id number]   cc:   Gwen Pounds, MD  Bernette Redbird, M.D.  Coletta Memos, M.D.

## 2010-12-27 NOTE — Discharge Summary (Signed)
[name], [name]                ACCOUNT NO.:  [id number]   MEDICAL RECORD NO.:  [id number]          PATIENT TYPE:  INP   LOCATION:  4709                         FACILITY:  [facility]   PHYSICIAN:  Jenna Pounds, MD       DATE OF BIRTH:  [birth year]   DATE OF ADMISSION:  05/19/2006  DATE OF DISCHARGE:  05/22/2006                                 DISCHARGE SUMMARY   DISCHARGE DIAGNOSES:  1. Dehydration.  2. Azotemia.  3. Failure to thrive and weakness.  4. Diabetes mellitus type 2.  5. Nondescript rheumatologic condition.  6. Polyneuropathy.  7. History of prior acute renal failure which easily resolves.  8. Diabetes mellitus type 2.  9. Hypothyroidism.  10.Hyperlipidemia/hypertriglyceridemia.  11.Fibromyalgia.  12.History of pericarditis.  13.History of pancreatitis.  14.Microalbuminuria.  15.History of leukocytosis.  16.Gout.  17.Hypokalemia.  18.Severe osteoporosis.  19.History of right inferior pubic ramus stress fracture in January of      2006.  20.History of appendectomy.  21.Familial hypocalciuric hypercalcemia.  22.History of gastritis.  23.History of left and right cataract surgeries.  24.History of peripheral visual field deficit in November 2003.  25.Polypectomy.  26.History of anemia with negative gastrointestinal workup.   DISCHARGE PROCEDURES:  1. EKG which shows normal sinus rhythm and a complete right bundle branch      block.  2. MRI/MRA of the head and brain; no evidence of acute ischemia;      nonspecific subcortical white matter changes supratentorial which      probably represent ischemic gliosis secondary to small-vessel disease;      two to three tiny lacunar infarcts and decrease caliber of the distal      basilar artery suggesting 50% stenosis. Otherwise no evidence of      intracranial aneurysm seen.   DISCHARGE MEDICATION LIST:  1. Neurontin 300 mg up to 4 times per day or 300 mg twice a day and 600 at      bedtime.  2. Lopid 600 b.i.d.  3.  Prednisone 5 daily.  4. Synthroid 112 mcg p.o. daily.  5. Zetia 10 mg p.o. daily.  6. Protonix 40 mg p.o. b.i.d.  7. Lasix 40 mg 1/2 to 1 tablet p.o. daily, prefer 1/2 for now to keep      edema from building up.  8. Lanoxin 0.125 mg p.o. daily.  9. Calcium 1500 mg daily.  10.Fish oil omega-3 b.i.d.  11.Aspirin 81 mg p.o. daily.  12.Glyburide 2.5 mg 1 tablet p.o. daily, can got up to b.i.d. if the      sugars are high.   She is going to hold her Glucovance, indomethacin and hydrochlorothiazide  for now.   HISTORY OF PRESENT ILLNESS:  This is a 77 year old female with multiple  medical problems who saw me earlier in the week that she was admitted  because she was dealing with several issues including fatigue, failure to  thrive, difficulty with gait, falling to the left, falling to the right,  anorexia, weight loss, decreased p.o. fluid consumption, dropping things out  of both hands - right greater than left,  and dizzy. When she saw me  yesterday, it was after her mammogram and ophthalmology visit with Dr.  Ashley [name] where he did laser, and she was to follow up on October 15. Ms.  [name] on her own stopped her hydrochlorothiazide because she thought it was  negatively affecting her. Her physical exam was nonspecific in the office. I  had her drink two 8-ounce glasses of water, and we reviewed recent labs and  went over differential diagnosis. I thought some anxiety may be a component  as well as dehydration. Her symptoms were not specific enough to consider it  a right or left brain stroke. Because of no orthostatic blood pressure  changes, I elected to check labs and have her go home and push fluids. The  following day, she got worse. She dropped more things. She went to the  emergency room for evaluation and treatment and a cranial CT to rule out  stroke, and she was having difficulty concentrating. In the emergency room,  she was seen and evaluated. I was called, and we put her  into the hospital.  Everything was pretty much pending by the time I saw her.   HOSPITAL COURSE:  This is a 77 year old female with multiple medical  problems and multiple nondescript complaints being admitted with failure to  thrive, weakness, gait disturbance, dehydration and question TIA versus CVA.  She was given IV fluids. We kept her prednisone at her baseline dose.  Cranial CT and MRI and MRA were obtained which did not illicit any positive  or negative findings. Anxiety was controlled, and we elected to manage her  medicines. We considered neurologic input as needed. Overnight as her labs  came back, it was obvious that she was more than just minorly dehydrated  with a BUN of 56, creatinine 2.2. Therefore, because of her dehydration and  azotemia, we held her Glucophage, we held her hydrochlorothiazide, and we  held her NSAIDs, and we just continued to rehydrate her. We felt her failure  to thrive and weakness was secondary to dehydration and azotemia. Physical  therapy and occupational therapy were ordered. Again, the MRI and MRA came  back negative. Over the next couple of days and her mental ability improved,  and she was ambulated with physical therapy. I saw her on May 22, 2006.  Her BUN was 24, creatinine was 1.3. The rest of her labs were fine. Since  she was off of the Glucophage, we actually put her on glyburide alone, and  it actually caused some lows and then adjusted her medications  appropriately. Since her dehydration and azotemia were better, it was okay  to watch her throughout the day, and later in the afternoon, we did send her  home. Her failure to thrive and weakness was improved to the point where she  was able to be trusted to go home with her family. Her grip issues were  nondescript and improved throughout her hospital stay. Her diabetes had a  plan, and she was going to follow up with me as an outpatient. She remained on prednisone, and she will remain  on that for life, and peripheral  neuropathy, Neurontin works; I was  holding it while in the hospital due her renal insufficiency and renal  issues, and by the time she ready to go home, she was able to go on full-  dose Neurontin.   AFTER-CARE FOLLOWUP:  She will follow up with me as an outpatient, and we  will follow labs  accordingly.      Jenna Pounds, MD  Electronically Signed     JMR/MEDQ  D:  06/17/2006  T:  06/17/2006  Job:  [id number]

## 2010-12-27 NOTE — H&P (Signed)
[name], [name]                ACCOUNT NO.:  [id number]   MEDICAL RECORD NO.:  [id number]          PATIENT TYPE:  INP   LOCATION:  5704                         FACILITY:  [facility]   PHYSICIAN:  Gaspar Garbe, M.D.DATE OF BIRTH:  [birth year]   DATE OF ADMISSION:  07/25/2006  DATE OF DISCHARGE:                              HISTORY & PHYSICAL   CHIEF COMPLAINT:  Diarrhea, weakness, dehydration.   HISTORY OF PRESENT ILLNESS:  The patient is a 77 year old white female  with a history of on and off chronic diarrhea which began in the spring  for which she sees Dr. Matthias Hughs over at [facility].  She has had endoscopy  and biopsy at that time which showed her to have a lymphocytic  proliferation in her GI tract that was thought to be the cause of her  diarrhea.  At the time they were discussing treatment options, the  patient's diarrhea suddenly got better.  Over the past two months she  has had a recurrence in she diarrhea and has been in contact with Dr.  Matthias Hughs.  His initial thought was that some of this may be related to  diabetic issues and she was started on clonidine which was titrated up  to 0.2 mg b.i.d.  Patient felt weak and tired being on this medication  due to its blood pressure effects, and it was discontinued.  She has  continued to have further diarrhea and continued to have contact with  Dr. Donavan Burnet office as well, and was started on Anticort  3 mg 3  tablets each morning for the past two days.  She clearly has not had an  immediate response from this, and I was called by her family earlier  today indicating that she was generally weak, that she had spoken with  Dr. Matthias Hughs, and that she required admission to the hospital.  She was  taken to the ER by her family and was seen by me several hours later  after initial workup in the emergency room.   Patient indicates that she is eating perfectly well.  She indicates that  she had country style steak and green beans last  night, was able to eat  bacon and eggs this morning, but that everything goes right through  me.  She has been feeling generally weak and indicates a weight loss of  15 pounds in 2 months.  She is normally cared for by her son at home she  has lived with for quite some time now but she has just generally felt  weak and requires IV hydration.  Evaluation in the emergency room found  her to be pre-renally azotemic.  She was able to produce a bowel  movement which was sent for stool studies, for C. diff, ova and  parasites and enteric pathogen.  No other workup had been performed by  the emergency room at the time of my seeing her.   ALLERGIES:  ALTACE, DIOVAN, ACTOS, AND FOSAMAX.   MEDICATIONS:  1. Lopid 600 mg p.o. b.i.d.  2. Zetia 10 mg once daily.  3. Vitamin E 50,000  units 2 times a week.  4. Lasix 40 mg p.o. daily.  5. Prednisone 5 mg p.o. daily.  6. Anticort 3 mg 3 tablets q. a.m.  She has taken it x2.  7. Lanoxin 0.125 mg p.o. daily.  8. Patient has Indocin 50 mg p.r.n.  She indicates she has not taken      it in over a month.  9. Synthroid 112 mcg p.o. daily.  10.Protonix 40 mg p.o. daily.  11.Absent from her regimen was anti-diabetes medicine although she      carries a diagnosis.  12.Aranesp- Dosing unknown, administered bt [facility].   PAST MEDICAL HISTORY:  1. Chronic diarrhea noted as above, thought to be a lymphocytic      infiltration.  2. Diabetes mellitus type 2.  3. Acute renal insufficiency episode secondary to dehydration.  4. Fibromyalgia.  5. Hypothyroidism.  6. Hyperlipidemia.  7. History of polyneuropathy.  8. History of unknown rheumatologic disease, placed on prednisone per      Dr. Phylliss Bob.   PAST SURGICAL HISTORY:  Patient has had an appendectomy, and bilateral  cataracts.   SOCIAL HISTORY:  Patient lives in [place] with her son.  She is  retired, and a nonsmoker, nondrinker.  They have well water but she  drinks bottled water.  No sick  contact.  She has one cat that has been  healthy.   FAMILY HISTORY:  Positive for strokes, CHF, and diabetes.   REVIEW OF SYSTEMS:  Patient denies fevers, chills, sweats, having  headaches and shortness of breath.  Indicates that she had slight nausea  but has not had any vomiting over the past 2 months.  Indicates  diarrhea, with loose bowel movements about 8 to 9 per day.  Generally it  is runny and foul-smelling but without any blood seen in them.  No dark,  tarry stools have been noted, and no pain other than for soreness around  her rectum and anus.  Denies any urinary symptoms.  All other systems on  15-point review are negative.  *The patient is a full code.   PHYSICAL EXAMINATION:  VITAL SIGNS:  Temperature afebrile, pulse 64,  respiratory rate 18, blood pressure 138/61, saturating 98% on room air.  GENERAL:  No acute distress.  HEENT:  Normocephalic, atraumatic.  PERRLA.  EOMI.  Within normal  limits.  NECK:  Supple, no lymphadenopathy, JVD, or bruit are appreciated.  HEART:  Regular rate and rhythm.  Patient has a 1/6 ejection murmur in  the right upper sternal border.  This has been noted in prior dictation.  LUNGS:  Clear to auscultation bilaterally.  ABDOMEN:  Soft, slightly diffuse tenderness, without any rebound or  guarding.  Hyperactive bowel sounds.  RECTAL:  Patient is guaiac negative.  EXTREMITIES:  No clubbing, cyanosis or edema are appreciated.  NEURO:  Oriented x3.  Cranial nerves II-XII are intact with 5/5 strength  bilaterally.  MUSCULOSKELETAL:  No joint deformities are noted.  SKIN:  No rashes or lesions are noted.   LABORATORIES:  Patient has a CBC with a white count of 9, a hemoglobin  of 10, hematocrit of 30, platelets of 378.  BUN and creatinine are both  elevated at 64 and 2.1.  Her sodium is normal at 135 and potassium is  normal at 1.4.  Glucose is slightly elevated at 233.  Urinalysis shows specific gravity of 1.012 but without blood cells or  signs of infection.   ASSESSMENT/PLAN:  1. Chronic diarrhea:  I have  spoken with Dr. Nadine Counts [name] with [name]      who is very well familiar with the patient.  I have elected to      admit the patient because he was concerned with a number of her      other medical issues.  We had a long discussion about what she may      need, and opted to perform a CT with oral contrast only of her      abdomen and pelvis this evening, given that she is dehydrated and      IV contrast would pose too much of a risk for her kidneys at this      point.  In addition, we will be performing antigliadin and PPG      testing to rule out celiac disease.  I have also opted to check her      Vitamin B and B12 levels which may show some indication of      malabsorption, if they are low.  In addition, I will check an iron      panel as she is somewhat anemic, although her MCV is normal at this      time.  I have opted to keep the patient off her Anticort as two      days is probably not a good enough trial to see if it is in fact      helping, but if indicated, Dr. Matthias Hughs might desire to have the      patient biopsied and scoped at hospitalization.  He is in agreement      with this plan, and tentatively is planning to perform a      colonoscopy on Monday.  He will consult with the patient tomorrow      morning.  2. Diabetes mellitus type 2:  Absent are any sulfonylurea.  She has      been on these before.  Keep her on a sliding scale and possibly      just manage her with insulin at this time.  Somewhat difficult to      know what to do as she is somewhat dehydrated and if she is in fact      malabsorbing, I do not want her blood sugars going too low;      therefore she is on sliding scale of NovoLog, only to be given with      meals, and a standard moderate level of sliding scale.  Will also      check a hemoglobin A1c on her.  3. Hypothyroidism:  Will check her TSH.  If her Synthroid dose is off,      may  consider supplementation with IV Synthroid as malabsorption may      be an issue with this.  4. Will currently hold her Vitamin D and check a level.  5. I am not certain from her history why the patient is on Digoxin.      She could not comment either on her renal insufficiency. I will      check her level.  If elevated, hold this medication.  6. Hyperlipidemia:  Will hold her Zetia and Lopid, choosing to give      her cholestyramine 2 gm p.o. b.i.d. instead.  Will also provide      Kaopectate one dose after each bowel movement.  She will also be      placed on a low-residue diet.  7. Will hydrate the patient with  normal saline and follow her renal      indices.  8. Rheumatologic:  I am not certain what her disease status is, but      she is maintaining a normal blood pressure.  Somewhat concerned      that if she becomes acutely ill, that she may require stress dose      steroids, but she simply seems to be failing to thrive due to her     diarrheal output being greater than her p.o. intake of fluids, and      maintenance.      Gaspar Garbe, M.D.  Electronically Signed     RWT/MEDQ  D:  07/25/2006  T:  07/26/2006  Job:  [id number]   cc:   Bernette Redbird, M.D.  Gwen Pounds, MD

## 2010-12-27 NOTE — Op Note (Signed)
[name], [name]                ACCOUNT NO.:  [id number]   MEDICAL RECORD NO.:  [id number]          PATIENT TYPE:  INP   LOCATION:  5704                         FACILITY:  [facility]   PHYSICIAN:  John C. Madilyn Fireman, M.D.    DATE OF BIRTH:  September 15, 1930   DATE OF PROCEDURE:  07/27/2006  DATE OF DISCHARGE:                               OPERATIVE REPORT   INDICATIONS FOR PROCEDURE:  Chronic diarrhea and failure to thrive with  the previous biopsy showing lymphocytic colitis. Procedure is to assess  for any pseudomembranes or visible colitis that might alter current  therapy consisting of Entocort.   PROCEDURE:  The patient was placed in the left lateral decubitus  position and placed on the pulse monitor with continuous low-flow oxygen  delivered by nasal cannula. She was sedated 60 mcg IV fentanyl and 5 mg  IV Versed.  The Olympus video colonoscope was inserted into the rectum  and advanced to the cecum confirmed by visualization of ileocecal valve  and appendiceal orifice.  The prep was somewhat limited but no bowel  prep was given for this procedure.  There were certain areas that I  could not clearly see the mucosa due to stool and I could not rule out  lesions less than 1-2 cm in all areas.  Otherwise in the cecum there  were patches of yellowish exudate somewhat suggestive of  pseudomembranes. This area was biopsied. The ascending, transverse and  descending colon appeared normal with no further inflammatory changes,  polyps or diverticula. Within the sigmoid colon there was a similar  appearance patchy whitish-yellow exudate somewhat suggestive of  pseudomembranes also consistent in appearance with Crohn's disease.  Biopsies were taken of this area as well.  There was a transition to  normal mucosa about 25 cm and distal to this point there were no further  inflammatory changes. Aspirate was obtained and sent for C.  Difficile  toxin. Scope was then withdrawn and the patient returned to  the recovery  room in stable condition.  She tolerated the procedure well.  There were  no immediate complications.   IMPRESSION:  Visible colitis in the cecum and sigmoid colon unclear  whether inflammatory bowel disease or possibly pseudomembranous colitis.  Plan; await all biopsy results and aspirate.           ______________________________  Everardo All. Madilyn Fireman, M.D.     JCH/MEDQ  D:  07/27/2006  T:  07/27/2006  Job:  [id number]   cc:   Gaspar Garbe, M.D.

## 2010-12-27 NOTE — Discharge Summary (Signed)
[name], [name] NO.:  [id number]   MEDICAL RECORD NO.:  [id number]          PATIENT TYPE:  INP   LOCATION:  3022                         FACILITY:  [facility]   PHYSICIAN:  Gwen Pounds, MD       DATE OF BIRTH:  1930-09-22   DATE OF ADMISSION:  11/14/2008  DATE OF DISCHARGE:  11/22/2008                               DISCHARGE SUMMARY   PRIMARY CARE Danene Montijo:  Gwen Pounds, MD.   DISCHARGE DIAGNOSES:  1. Metabolic encephalopathy, now resolved.  2. Hypertensive urgency.  3. Anemia.  4. Deconditioning.  5. Diabetes mellitus type 2.  6. Neuropathy and pain.  7. Constipation.  8. Chronic hypertension.  9. Pubic ramus fracture.  10.Atelectasis on chest x-ray.  11.Resolved mental status changes.  12.Lumbar compression fracture.  13.Diabetes complications of neuropathy and nephropathy.  14.Hypothyroidism.  15.Fibromyalgia.  16.Chronic gout.  17.Cholecystitis, status post laparoscopic cholecystectomy in November      2009.  18.Anxiety and underlying depression.  19.History of altered mental state in October 2008, due to possible      metabolic encephalopathy versus transient ischemic attack versus      medications.  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  21.Chronic leukocytosis.  22.Severe osteoporosis with vitamin D deficiency.  23.History of colon polyps.  24.History of lymphocytic colitis.  25.Left hip hematoma.   DISCHARGE MEDICATIONS:  1. Aspirin daily, but hold for the first 5 days in rehab.  2. CBG b.i.d.  3. Incentive spirometer q.1 hour while awake.  4. Aggressive bowel prep.  5. Miacalcin nasal spray 1 spray daily alternate nostrils on a q.i.d.      basis.  6. Vicodin 1-2 tablets q.6 h., p.r.n. pain.  7. MiraLax 17 grams p.o. daily.  8. Tylenol 650 mg p.o. q.6 h., p.r.n. pain.  9. Dulcolax suppository daily p.r.n. constipation.  10.Ultram 50 mg p.o. b.i.d. p.r.n. severe  pain.  11.Iron 325 mg p.o. daily.  12.Senokot-S 2 p.o. nightly.  13.Prednisone 5 mg p.o. daily.  14.Hydralazine 25 mg p.o. t.i.d., hold for systolic blood pressure      less than 125.  15.Protonix 40 mg p.o. daily.  16.Synthroid 112 mcg p.o. daily.  17.Januvia 100 mg p.o. daily.   DISCHARGE PROCEDURES:  1. Medical management.  2. Neurology consult with Dr. Vickey Huger.  3. Telemetry monitoring.  4. MRI of the brain on November 15, 2008, shows atrophy and chronic small      vessel disease.  No sign of acute or reversible process.  5. Chest x-ray on November 20, 2008, shows stable cardiomegaly,      interstitial prominence, bibasilar opacity, likely atelectasis.  6. Hip x-ray on November 20, 2008, shows no definitive hip fracture,      healing right superior-inferior pubic ramus fractures are subacute.      Degenerative changes of lumbar spine and SI joints bilaterally.   HISTORY OF PRESENT ILLNESS:  Briefly, [name] is a 77 year old  white female well-known to my and my service with a history of type 2  diabetes, hypertension, hyperlipidemia and inpatient admission for  altered mental state in October 2008, seemed at that time associated  with her very high blood pressure readings.  She got better all on her  own at that visit after 2-3 days of being left alone.  She presented to  medical attention on November 14, 2008, with falling, back and pelvic pain.  Falls that are associated with back and pelvic pain.  When she fell, she  hit her head.  She was brought to the emergency room.  While there, she  developed acute changes in her mental state with difficulty with speech  and word finding and an inappropriate speech pattern.  Increasing  confusion.  Cranial CT was done and showed no acute intracranial  abnormalities, but there has been extensive small vessel disease  changes.  The mental status changes correlated with her blood pressure  rising from the 130s up to about 200/110.  She had  no change in her  underlying medication.  She had no underlying infections.  Dr. Sherryll Burger was  called for inpatient admission.  White count was 18,000, which has not  been atypical for her.  The rest of labs were unremarkable.  Acetaminophen, Aspirin and digoxin levels are undetectable.  Ammonia  level was 20.  Right shoulder x-ray was done in the emergency room and  was negative.  Chest x-ray showed no acute cardiopulmonary disease.  EKG  showed no new changes or anything acute.   HOSPITAL COURSE:  Ms. Tane Biegler was admitted to the emergency  department on November 14, 2008, with acute mental status changes, altered  mental state, and presumed Broca's aphasia.  This was several hours  after her fall.  It was concerning for a possible concussion versus  acute brain injury versus subdural hematoma versus for some other  neurologic process.  She was admitted to a step-down bed.  MRI of the  brain was ordered and Dr. Vickey Huger from neurology was consulted.  Urine  drug screen, alcohol, cortisol, ANA and sed rate were all obtained and  was it felt that TPA was not indicated.  As for her blood pressure sky  high, the goal was to also bring this back down.  She was placed on  insulin sliding scale and DVT prophylaxis.  Dr. Vickey Huger suspected a  left parietal lobe stroke.  EEG and MRI were agreed upon.  The MRI was  negative for CVA and eventually the EEG showed encephalopathic process,  but no acute seizures.  Over the next several days, Ms. Loyola Duer  started awaking up, she started working with speech therapy, physical  therapy and occupational therapy.  She initially was on dysphagia diet  and as she was able to follow more and more commands and be more and  more mentally aware and alert, she was switched over to a carbohydrate  reduced diet.  As she was able to swallow, we switched her back over to  her oral medications.  An LP was decided that we did not need to go  ahead and do it.  Her  Foley catheter was eventually discontinued and on  November 19, 2007, my note reads much better.  She is sitting in the chair.  She is just complaining of some soreness.  For her hypertension issues,  this certainly could have been the source of the encephalopathy, as well  as pain and decreased function.  I adjusted her blood pressure medicines  and included hydralazine.  I was  very aware of the potential issues and  interactions with her lupus diagnosis, but she has tried and failed  practically every blood pressure medication on the market because she  refuses longer than a couple days without noticing some sort of  interaction or side effect that she is afraid of having.  We used that  at this point and it seems to be working.  For her anemia, she was  started on iron.  PT/OT case worker rehab and were pushed and blood  sugars remained relatively controlled and she was now on oral  medications.  X-rays were done and noted her subacute pubic ramus  fracture.  Because she really was not functioning very well a consult  from Dr. Shelle Iron from ortho, they said weightbearing as tolerated.  She  can use a walker and the symptoms mobilized without analgesics.  Over  the next couple of days she had an inpatient rehab consult and it was  determined she was a very appropriate fit for inpatient rehab and once a  bed became available on Dec 22, 2008, she was discharged over there.  Unfortunately, her hemoglobin did drop to a level of 7.3, and she did  require 2 units blood transfusion en route.   By November 22, 2008, the encephalopathic altered mental state process had  resolved.  We did not have a good source etiology and we just assumed  that it was the pain and blood pressure related.  Clearly concussion and  some other differential diagnosis could have contributed, but  considering that she got better on her own, conservative and careful  management was all that was needed.  All of her vital signs  and all of  her blood work remained relatively stable, except for the needing the  transfusion and occasional potassium supplementation.  She will be  discharged to rehab in stable condition and will complete her blood  products.  I will follow her while she is over at rehab.      Gwen Pounds, MD  Electronically Signed     JMR/MEDQ  D:  01/02/2009  T:  01/02/2009  Job:  [id number]

## 2010-12-27 NOTE — Consult Note (Signed)
[name], [name]                ACCOUNT NO.:  [id number]   MEDICAL RECORD NO.:  [id number]          PATIENT TYPE:  INP   LOCATION:  5522                         FACILITY:  [facility]   PHYSICIAN:  Shirley Friar, MDDATE OF BIRTH:  [birth year]   DATE OF CONSULTATION:  07/02/2005  DATE OF DISCHARGE:                                   CONSULTATION   REFERRING PHYSICIAN:  Gwen Pounds, M.D.   HISTORY OF PRESENT ILLNESS:  This consult was completed at the request of  Dr. Creola Corn in regards to the patient's anemia, heme positive stool, and  nausea.  The patient is a 77 year old white female admitted on June 28, 2005, with altered mental status, dehydration, and hypoglycemia.  She has  been managed medically by Dr. Timothy Lasso, Gaspar Garbe, M.D., and  colleagues.  During her hospitalization, her hemoglobin has trended down  from 10.8 to currently 9.0 with an MCV of 92.  She has been previously seen  by Bernette Redbird, M.D. in consultation in 2005 and she underwent an upper  GI series at that time which showed erosive antral gastritis and erosive  duodenitis.  A small sliding hiatal hernia, mild to moderate distal  esophageal dysmotility, and gastroesophageal reflux disease is noted as  well.  She has had a colonoscopy done in 2003 which showed a small  hyperplastic polyp, but otherwise was negative.   Prior to coming to the hospital, she states that she has been having 2-3  weeks of soft stool that she calls diarrhea.  She states that it is normal  in color (brown).  She denied any vomiting or nausea prior to coming to the  hospital, but had acute onset of nausea early this morning that woke her up  from her sleep.  She does also report left-sided abdominal pain for the last  week and she states that she chose not to tell her doctors about this pain  while she has been in the hospital.  She states that it comes and goes, but  it is present and normally she does not have abdominal  pain.  Of note, the  patient has an increased white blood cell count today up 14.6 with pending  differential.  Her BUN and creatinine is currently 16 and 1.4, potassium  3.2.  She denies any hematemesis, hematochezia, weight loss prior to coming  to the hospital, and to her knowledge has not had any here in the hospital.  She has been afebrile throughout her hospital stay.   PAST MEDICAL HISTORY:  1.  Diabetes mellitus type 2.  2.  Fibromyalgia.  3.  Hypothyroidism.  4.  Gout.  5.  History of pancreatitis.  6.  History of anemia.  7.  Hyperlipidemia.  8.  Osteoporosis.   MEDICATIONS:  1.  Cipro 250 mg p.o. daily for UTI.  2.  Aranesp.  3.  Lanoxin.  4.  Lovenox.  5.  Ensure.  6.  Zetia.  7.  Ferrous sulfate.  8.  Neurontin.  9.  Lopid.  10. Amaryl.  11. Synthroid.  12. Protonix.  13. Prednisone.   PHYSICAL EXAMINATION:  VITAL SIGNS:  Temperature 98.1, pulse 79, blood  pressure 130/71, O2 saturations 97% on room air.  GENERAL:  Alert in no acute distress.  HEENT:  Anicteric sclerae.  Oropharynx clear.  CHEST:  Clear to auscultation anteriorly.  CARDIOVASCULAR:  Regular rhythm without murmurs.  ABDOMEN:  Left lower quadrant tenderness, otherwise nontender, soft,  nondistended, active bowel sounds, no masses palpated, no rebound, positive  voluntary guarding in left lower quadrant.   LABORATORY DATA:  White blood cell count 14.6, hemoglobin 9, hematocrit 26,  platelet count 304.  Sodium 141, potassium 3.2, chloride 106, CO2 21, BUN  16, creatinine 1.4, glucose 78, total bilirubin 0.6, ALP 53, AST 18, ALT 11,  albumin 2.7.  Positive fecal occult blood testing x3.   ASSESSMENT:  A 77 year old white female with past medical history as stated  above, now with continued left lower quadrant abdominal pain, heme positive  stools, anemia, and increased white blood cell count.  Differential  diagnosis includes ischemic colitis versus infectious colitis versus  diverticulitis.   The patient had a colonoscopy 3 years ago, so it is  unlikely that she has a malignancy in her colon, although, not impossible.  One concern I have is that she may have diverticulitis or C.difficile  colitis and this needs to be further evaluated.  Due to her renal  insufficiency, may be problematic to give her IV contrast for a CAT scan,  but may need to consider MRI scanning to further evaluate this.  If her  anemia continues, we recommend an inpatient MRI to further evaluate this and  possible sigmoidoscopy, but at this time, I do not see any indication for  inpatient procedure in terms of endoscopy.  I would recommend treating  empirically for possible diverticulitis with Cipro, Flagyl, and if her  anemia improves have the patient follow up as an outpatient for outpatient  colonoscopy and follow with her primary doctor and Dr. Matthias Hughs or with  myself.  We will add on a differential to her CBC and continue to follow  closely.  Due to leukocytosis and left lower quadrant pain, I would be  hesitant to do a sigmoidoscopy at this time unless imaging study negative  for diverticulitis or if the patient begins having frank rectal bleeding.      Shirley Friar, MD  Electronically Signed     VCS/MEDQ  D:  07/02/2005  T:  07/02/2005  Job:  [id number]   cc:   Gwen Pounds, MD  Fax: [phone]

## 2010-12-27 NOTE — Discharge Summary (Signed)
[name], [name]                ACCOUNT NO.:  [id number]   MEDICAL RECORD NO.:  [id number]          PATIENT TYPE:  INP   LOCATION:  5733                         FACILITY:  [facility]   PHYSICIAN:  Gwen Pounds, MD       DATE OF BIRTH:  07/22/1931   DATE OF ADMISSION:  07/25/2005  DATE OF DISCHARGE:  07/28/2005                                 DISCHARGE SUMMARY   PRIMARY CARE Pattiann Solanki:  Myself.   PRIMARY GASTROENTEROLOGIST:  Bernette Redbird, M.D.   RHEUMATOLOGIST:  Areatha Keas, M.D.   DISCHARGE DIAGNOSES:  1.  Dehydration.  2.  Loose bowel movements.  3.  Abdominal pain.  4.  Leukocytosis.  5.  Type 2 diabetes.  6.  Hypothyroidism.  7.  Hyperlipidemia.  8.  Diabetic neuropathy.  9.  Fibromyalgia.  10. History of pericarditis.  11. 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  12. History of pancreatitis.  13. History of microalbuminuria.  14. History of gout.  15. History of hypokalemia.  16. Severe osteoporosis with history of right inferior pubic ramus stress      pelvic fracture.  17. Ejection fraction 50-60%.  18. History of bladder tack operation.  19. Right eye cataract surgery in March 2003.  20. History of polypectomy in November 2003.  21. Peripheral visual field deficit, June 28, 2002, now resolved.  22. Left cataract surgery, March 2004.  23. History of severe gastritis.  24. History of familial hypercalcemia and hypocalciuria.  25. History of presumed diverticulitis.  26. History of anemia requiring transfusion.  27. History of azotemia, resolved.  28. History of urinary tract infection.   DISCHARGE MEDICATIONS:  1.  Prednisone 7.5 mg p.o. daily.  2.  Calcitonin as directed.  3.  Protonix 40 daily.  4.  Zetia 10 daily.  5.  Calcium and vitamin D twice a day.  6.  Lanoxin 0.125 mg p.o. daily.  7.  Synthroid 112 mcg p.o. daily.  8.  Neurontin 300 mg p.o. three times a day.  9.  Vitamin D 50,000 unit every other week.  10. Diabeta 10  mg in the A.M. and 5 mg in the P.M.  11. Xanax p.r.n.  12. Lasix 20 mg 2 tablets per week p.r.n.; hold if there is no edema and/or      hold if there is diarrhea.  13. The patient is going to hold Lopid and the Glucovance for now.   DISCHARGE PROCEDURES:  1.  Medication management.  2.  Kidneys, ureters, bladder revealing no radiographic evidence of      obstruction.  3.  Gastrointestinal consultation.   HISTORY OF THE PRESENT ILLNESS:  [name] is a 77 year old female  with multiple medical problems status post recent admission from June 29, 2006 to July 06, 2006 for dehydration, azotemia, hypoglycemia,  urinary tract infection, leukocytosis, hypertension, hypercalcemia, and  altered mental status.  During that hospital stay she had issues with  diarrhea, presumed diverticulitis, acute gouty arthritis, anesthesia, anemia  requiring blood transfusion, and requiring medication adjustments.  During  that hospital stay she  was treated with Cipro, Flagyl, increased  steroids,  colchicine, packed red blood cells, and IV fluids.  Dr. Doy Mince,  partner of Dr. Matthias Hughs, saw the patient for me and helped with managing her  course. After discharge she saw me on July 09, 2005 and July 16, 2005, and was doing better each time.  I continued to readjust her  medications and followed them accordingly.  Blood work on July 16, 2005  showed sodium 137, potassium 5.1,  chloride 104, bicarb 21 BUN 28 creatinine  1.1, and glucose 188 with normal LFTs .  White count was 13.1, hemoglobin  was 10.3 and sed rate was 112.  She remained on prednisone 7.5 mg per day  per Dr. Phylliss Bob and myself.   Since the Sunday prior to admission she had increasing diarrhea of up to  eight to 10 bowel movements per  day; however, it was worse the day before  admission and the day of admission.  She denies any blood just dark stools.  She feels weak and with a questionable fever, some nausea  without vomiting  and increased fatigue. She denies any chest pain or shortness of breath.  She presents to the emergency room on July 25, 2005 and was diagnosed  with dehydration.  The patient was given 1 liter of normal saline and I was  called to admit her.   On my evaluation all vital signs are stable.  She has a BUN of 69 and a  creatinine of 1.9.  White count is up to 21,500; and, the abdominal exam  shows a distended, diffusely mildly tender abdomen with increased bowel  sounds, without any rebound or guarding.   HOSPITAL COURSE:  [name] is an elderly female with multiple  medical problems who was admitted with loose bowel movements, questionable  diverticulitis, dehydration, azotemia, and leukocytosis.  We admitted the  patient and gently hydrated her.  She was placed on Lomotil.  The patient  was status post a recent course of Cipro and Flagyl, and I decided to hold  the antibiotics for the time being.  We went ahead and checked a full course  of stool studies and asked GI to reevaluate the patient.  We reviewed the  medications and she is currently off the Glucophage, Lopid and iron.  Over  the next couple of days she was placed on bowel rest and given IV fluids.  Her white count came down, her vital signs remained normal and stool  cultures were obtained.  GI felt there was no need to do a flex sig or a  colonoscopy, and felt that we should stay away from antibiotics  unless she  becomes C difficile  positive.   On July 28, 2005 her diarrhea was much better. Sh had no abdominal  issues.  She had mild nausea the day before, but none on this date.  She was  back to normal bowel movements.  Her bowel sounds were stable although she  did have some erratic blood pressures.  Heart rate blood sugars were kind  of all over the place.  Her physical exam was fine.  Her lab work was perfect with a BUN of 15, creatinine 0.9 and glucose was 100.  Her white  count was  still elevated, but the lactate was negative.  Fecal leukocytes  were negative.  C diff was negative.  Blood cultures  showed no growth to  date; and, the rest of her labs were fine.  For her dehydration her BUN and  creatinine were back to normal.  The dehydration was better and it was felt  that we could discontinue the IV fluids.  Her diarrhea had resolved as  stated above. The diabetes was better on July 28, 2005, but she may need  aggressive outpatient follow up.   The patient's neuropathy remained appropriate.  Again, her blood pressure  was erratic and may need to  adjusted.  Her hemoglobin was down to 10 and  this will need to be followed as an outpatient.  Her sed rate was elevated  to 220.  I plan on following her up as an outpatient and discussing this  with Dr. Phylliss Bob.  The leukocytosis will be followed appropriately as a  outpatient; and, it was felt that maybe we could discharge her later in the  day.   The patient's blood sugar remained between 101 and 193.  Her vital signs  remained stable and we felt that she was okay for discharge later that day.  GI saw her on July 28, 2005 as well and felt that her diarrhea was much  better for unclear reasons.  The C diff was negative times three.  Stool was  negative for white cells; and, there was a question whether there was an  underlying viral etiology.  They agreed with discharged and felt that the  patient could see Dr. Matthias Hughs back in the office if the diarrhea persists.   __________  nurse felt that no referral needed to be made.   DISPOSITION:  The patient was discharged home in stable condition on  July 28, 2005.   FOLLOW UP:  The patient will follow up with me in one to two weeks.      Gwen Pounds, MD  Electronically Signed     JMR/MEDQ  D:  09/17/2005  T:  09/18/2005  Job:  [id number]   cc:   Bernette Redbird, M.D.  Fax: [phone]   Areatha Keas, M.D.  Fax: [phone]

## 2010-12-27 NOTE — Discharge Summary (Signed)
Jenna [name], [name]                ACCOUNT NO.:  [id number]   MEDICAL RECORD NO.:  [id number]          PATIENT TYPE:  INP   LOCATION:  5501                         FACILITY:  [facility]   PHYSICIAN:  Gwen Pounds, MD       DATE OF BIRTH:  06-10-1931   DATE OF ADMISSION:  04/19/2007  DATE OF DISCHARGE:  04/23/2007                               DISCHARGE SUMMARY   DISCHARGE DIAGNOSES:  1. Dehydration, failure to thrive, weakness, nausea.  2. Significant recurrent diarrhea.  3. Type 2 diabetes.  4. Hypothyroidism.  5. Hyperlipidemia.  6. Diabetic neuropathy.  7. Fibromyalgia.  8. History of pericarditis.  9. Systemic lupus.  10.History of microalbuminuria.  11.Gout.  12.Lymphocytic colitis.  13.Osteoporosis.  14.History of inferior ramus stress fracture in January 2006.  15.History of known gastritis.  16.Left and right cataract surgeries.  17.Ejection fraction is at 50-60%.  18.Status post bladder tack operation.  19.Polypectomy via colonoscopy in January 2003.  20.Peripheral field defect.  21.Vitamin D deficiency.   MEDICATION LIST:  1. Protonix 40 q.day.  2. Lasix 20 mg q.day p.r.n. edema.  3. Vitamin D 50,000 units weekly.  4. Neurontin 400 mg p.o. t.i.d.  5. Synthroid 112 mcg p.o. q.daily.  6. Lanoxin 0.125 mg p.o. q.day.  7. Calcium plus vitamin D q.daily.  8. Zetia 10 q.day.  9. Prednisone 7.5 mg p.o. q.day.  10.Questran 4 grams in 8 ounces of water b.i.d.  11.Reglan 5 mg t.i.d. a.c. for nausea.  12.Hold Flora-Q.  13.Flagyl 500 mg p.o. q.day x3 weeks, then discontinue completely.   HISTORY OF PRESENT ILLNESS:  This is a 77 year old female with multiple  hospital stays for recurrent diarrhea.  During this stay, she had  dehydration, azotemia, and was ill.  She got better with medical  management including IV fluids, Flagyl, and the addition of Flora-Q and  cholestyramine.  This dramatically helped and lowered her GI and stool  output.  Her azotemia got as high as a  creatinine of 2.1.  This improved  prior to discharge.  She followed up with me on March 25, 2007.  She  was doing much better.  She was having formed bowel movements, much less  diarrhea, was feeling stronger, and we had discussions about her fluid,  her lower extremity edema, and had her restart some Lasix.  Her weight  was 133 pounds at that point.  Laboratory data was checked, and her BUN  was 14, creatinine was 1.0, and a white count was 17.4 (She has chronic  leukocytosis.).  Her hemoglobin was 12.5.  Jenna [name] started getting ill  on the Friday before admission and worse since Sunday, the day prior to  admission, with increasing nausea.  No vomiting.  No diarrhea.  Decreased p.o. intake.  On presentation to me, she was extremely weak.  Her mouth was dry.  She was dehydrated and ill, and she needed admission  for evaluation and treatment.   HOSPITAL COURSE:  Jenna [name] was admitted with multiple GI issues,  but specifically for nausea, anorexia, dehydration, weakness, and  failure to  thrive.  She was admitted, given IV fluids.  A KUB was  checked.  Antiemetics were given and conservative treatment were given.  With the conservative treatment, she improved nicely over time.  All her  vital signs remained stable.  Her blood work looked okay.  Her blood  sugars were elevated and required some adjusting of her diabetic  regimen.  Her sodium was low, and through IV fluids this was  replenished.  Her hemoglobin remained stable.  Her thyroid was fine.  Her uric acid level was high and this correlated with her foot pain.  Her KUB was negative, digoxin level was appropriate, and her white count  dropped from 15.3 to 8.5, so after her stomach started getting a little  bit better we elected to watch her.  As we started feeding her again,  the nausea and vomiting and anorexia returned.  Because she had known  gallstones, I checked a HIDA scan, which was otherwise unremarkable.  I   restarted the Questran and had questions whether this might be the  culprit.  We continued to hold the Flora-Q and the Flagyl at this point  because her diarrhea was fine and did not return.  I started her on some  Reglan to try and help things move through the stomach in case she had  some underlying diabetic gastroparesis.  I asked GI to come by and see  her and tell me whether I had everything under control like I thought I  did.  In the notes in the chart, they reviewed all of her past GI  history, and their plan was to consider treatment of her subacute  urinary tract infection, but continue my conservative treatment and just  follow at this point.  The following day, on August 29, she seemed  better, and I went ahead and did the HIDA scan.  The HIDA scan, as I  said before, was unremarkable.  After that, we increased her diet.  Her  symptoms resolved nicely.  I placed her on the current medical regimen  that she is being discharged on, and on April 10, 2007 she was seen and  discharged by Dr. Felipa Eth.   PLAN:  All her vital signs were normal, her labs were appropriate, and  she was eating, walking, and feeling good, and she was allowed to go  home and follow up with me in short order.      Gwen Pounds, MD  Electronically Signed     JMR/MEDQ  D:  05/17/2007  T:  05/17/2007  Job:  [id number]   cc:   Oretha Milch, MD  Bernette Redbird, M.D.  Areatha Keas, M.D.

## 2010-12-27 NOTE — H&P (Signed)
NAMEREIGNA, RUPERTO                ACCOUNT NO.:  [id number]   MEDICAL RECORD NO.:  [id number]          PATIENT TYPE:  INP   LOCATION:  5524                         FACILITY:  [facility]   PHYSICIAN:  Gaspar Garbe, M.D.DATE OF BIRTH:  Jun 08, 1931   DATE OF ADMISSION:  06/28/2005  DATE OF DISCHARGE:                                HISTORY & PHYSICAL   CHIEF COMPLAINT:  Altered mental status, dehydration, hypoglycemia.   HISTORY OF PRESENT ILLNESS:  The patient is a 77 year old white female with  a history of diabetes mellitus type 2, with neuropathy, fibromyalgia,  hypothyroidism, and osteoporosis, she was brought to the emergency room with  altered mental status.  On arrival her CBG was found to be in the 40s and  she was given D50.  BMET revealed that her BUN and creatinine were elevated  consistent with hydration, the patient was having altered mental status at  that point and was unable to give a detailed history.  Later the patient was  more alert and indicated that she was not having any problems with nausea,  vomiting or diarrhea, and was taking her medicines as directed.  However,  her pillbox count indicated that she had not taken pills in the past day and  a half, and she was confused on what medicines she was supposed to be  taking.  She did have her pill bottles put in a bag prior to her arrival.  The patient is being admitted for fluids, hypoglycemia, electrolyte  management.   ALLERGIES:  1.  ALTACE.  2.  HYZAAR.  3.  ACTOS.   MEDICATIONS:  1.  Synthroid 112 mcg p.o. daily, this was missing from her bag.  2.  Lasix 40 mg p.o. daily.  3.  ZETIA 10 mg p.o. daily.  4.  Protonix 40 mg b.i.d.  5.  Glucovance 2/500 two tablets twice daily.  6.  Hydrochlorothiazide 12.5 mg twice daily.  7.  Miacalcin nasal spray daily, missing from bag.  8.  Calcium with vitamin D 50,000 units two times a week.  9.  Lanoxin 0.125 mg daily.  10. Neurontin 300 mg t.i.d.  11. Indocin 25  mg p.o. b.i.d.  12. Prednisone 5 mg p.o. daily.   PAST MEDICAL HISTORY:  1.  Diabetes mellitus type 2 with neuropathy.  2.  Fibromyalgia.  3.  Hypothyroidism.  4.  Gout.  5.  History of pancreatitis.  6.  History of erosive antral ulcers with anemia.  7.  Hyperlipidemia.  8.  Osteoporosis.   PAST SURGICAL HISTORY:  1.  Bladder tacking.  2.  Bilateral cataracts.   SOCIAL HISTORY:  The patient lives in [place] with her son.  She is retired,  widowed with four children, nonsmoker, nondrinker.   FAMILY HISTORY:  Mother died of CHF and had a history of diabetes.  Father  died of a stroke.  She has three brothers with diabetes as well.   REVIEW OF SYSTEMS:  Only positive for chills.  The patient denies fever,  sweats, rashes or lesions.  Denies any chest pain, shortness of breath, or  any GI or GU disturbance.   ADVANCED DIRECTIVES:  The patient is a full code.   PHYSICAL EXAMINATION:  Temperature 97.9, pulse 76, respiratory rate 20,  blood pressure 145/55, and sating 92% on room air.  GENERAL:  In no acute distress.  HEENT:  Normocephalic and atraumatic, PERRLA, EOMI.  ENT is within normal  limits except that her mouth appears to be dry.  NECK:  Supple with no lymphadenopathy, JVD or bruit.  HEART:  Regular rate and rhythm, no murmur, rub or gallop.  LUNGS:  Clear to auscultation bilaterally.  SKIN:  No rashes or lesions.  ABDOMEN:  Soft, nontender, normoactive bowel sounds.  EXTREMITIES:  No clubbing, cyanosis or edema noted.  MUSCULOSKELETAL:  No joint deformities, no CVA tenderness.  NEUROLOGIC:  The patient is oriented to person and place, but not time.  Cranial nerves II-XII are grossly intact with strength intact as well with  no neurologic deficits noted.   LABS:  White count elevated at 13.0, hemoglobin 10.4, hematocrit 30.9,  platelets 351, BUN and creatinine on recheck in the morning, 73 and 2.1,  glucose was 29 at this point, sodium 139, potassium 3.9, no anion  gap  exists, troponin less than 0.05, Digoxin level 1.1, BNP 55.2, urinalysis  shows 20-50 white blood cells per high powered field with leukocyte esterase  positive and nitrite negative.  Calcium initially 13.2.   ASSESSMENT/PLAN:  1.  Dehydration most likely related to poor p.o. intake, diuretics and      possibly a urinary tract infection.  We will given intravenous fluids      containing glucose for reasons below and push p.o. fluids as best we      can.  2.  Acute renal insufficiency most likely secondary to dehydration.  We will      continue to hydrate her and continue to watch her numbers.  3.  Diabetes mellitus type 2 with hypoglycemia, this is secondary to      retained Glucovance secondary to her acute renal insufficiency.  We will      hold diabetes medicine, follow her CBGs q.4h. and provide D5 as standing      and D50 as needed.  4.  Urinary tract infection.  Cipro 250 mg daily x7 days, this is renally      dosed for her.  5.  Hypothyroidism.  We will continue Synthroid at 112 mcg a day.  We will      check a TSH as this was missing from her bag.  6.  Hypercalcemia.  This is most likely secondary to dehydration ingestion      and may also be of relative vitamin D toxicity due to her replacement.      I believe she was found to be vitamin D deficient with her osteoporosis      screening, that would explain her 50,000 units twice weekly, this has      been held and we will continue to hydrate her.  If her calcium starts to      rise again, we may consider using Lasix as well as an increase in IV      fluid, do not feel that she requires pamidronate at this time.  7.  History of antral gastritis.  We will continue Protonix b.i.d.  8.  Lovenox 30 mg subcu for deep venous thrombosis prophylaxis.      Gaspar Garbe, M.D.  Electronically Signed     RWT/MEDQ  D:  06/29/2005  T:  06/29/2005  Job:  [id number]

## 2010-12-27 NOTE — H&P (Signed)
[name], [name] NO.:  [id number]   MEDICAL RECORD NO.:  [id number]          PATIENT TYPE:  INP   LOCATION:  5733                         FACILITY:  [facility]   PHYSICIAN:  Gwen Pounds, MD       DATE OF BIRTH:  [birth year]   DATE OF ADMISSION:  07/25/2005  DATE OF DISCHARGE:                                HISTORY & PHYSICAL   PRIMARY CARE PHYSICIAN:  Gwen Pounds, M.D.   GASTROENTEROLOGIST:  Bernette Redbird, M.D.   UROLOGIST:  Excell Seltzer. [name], M.D.   CARDIOLOGIST:  Colleen Can. Deborah Chalk, M.D.   OB/GYN:  Gretta Cool, M.D.   RHEUMATOLOGIST:  Areatha Keas, M.D.   CHIEF COMPLAINT:  Loose bowel movements, dehydration, leukocytosis.   HISTORY OF PRESENT ILLNESS:  This 77 year old female with multiple medical  problems, status post recent admission from June 29, 2005, to July 06, 2005, for dehydration, azotemia, hypoglycemia, urinary tract infection,  leukocytosis, hypercalcemia and altered mental status.  During the hospital  stay she had issues with diarrhea, presumed diverticulitis, acute gouty  arthritis, anemia requiring transfusion and medication adjustments.  During  the hospital stay she was treated with Cipro, Flagyl, increased steroids,  Colchicine, packed red blood cells and IV fluids, etc.  Dr. Shirley Friar saw the patient for Dr. Matthias Hughs and made recommendations.  After  discharge she saw me on July 09, 2005, and on July 16, 2005, and was  doing better each time I saw her.  I had readjusted her medications and  actually got her back on the Glucovance to control her blood sugars.  I  followed her labs accordingly.  On July 16, 2005, her sodium was 137,  potassium 5.1, chloride 104, bicarbonate 21, BUN 28, creatinine 1.1, glucose  188.  Liver function tests were fine.  White blood cell count was 13.1 and  was elevated due to the prednisone, hemoglobin 10.3, platelet count 268, and  she was off of her Cipro and Flagyl.  Her  sedimentation rate was 112.  I had  talked to Dr. Phylliss Bob as an outpatient, and he was going to review her records  and get her in towards the middle to the end of this month, maybe even early  next month, and discuss long-term outlook and her long-term medications for  her non-specific auto-immune disorder.  She remains on prednisone 7.5 mg  daily.  Since Sunday, her diarrhea has increased.  She is now having 8-10  loose bowel movements per day, worse than yesterday and today, worse in the  morning and lets up during the day.  No blood, just dark.  It is worse  between 4 a.m. and 9 a.m., as stated above and lessens.  She is weak, tired  and questions whether she has been having fevers.  Occasional nausea, no  vomiting.  Fatigue noted.  No chest pain, no shortness of breath.  She is  status post 1 liter of normal saline in the emergency department, and for  the azotemia.  I was called for the patient's admission.  She  has  dehydrated.   PAST MEDICAL/SURGICAL HISTORY:  1.  Type 2 diabetes.  2.  Hypothyroidism.  3.  Hyperlipidemia.  4.  Diabetic peripheral neuropathy.  5.  Fibromyalgia.  6.  History of pericarditis.  7.  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  8.  History of pancreatitis.  9.  History of microalbuminuria.  10. Gout.  11. History of hypokalemia.  12. Severe osteoporosis with a history of right inferior pubic ramus stress      pelvic fracture.  13. Ejection fraction of 50%-60%.  14. History of bladder tack operation.  15. Right eye cataract surgery in March 2003.  16. History of polypectomy in November 2003.  17. Peripheral visual field deficit on June 28, 2002, now resolved.  18. Left cataract surgery in March 2004.  19. History of severe gastritis.  20. History of familial hypercalcemia, hypocalciuria.  21. History of presumed diverticulitis, no history of anemia, requiring      transfusion.  22. History of azotemia, resolved.  23.  History of urinary tract infections.   SOCIAL HISTORY:  She is widowed.  She has four children and two  grandchildren.  She does not smoke.  She does not drink.  She is retired.   FAMILY HISTORY:  Mother died of congestive heart failure and diabetes.  Father died with a history of a stroke.  She has three brothers, all with  diabetes.   ALLERGIES:  ALTACE with hives, ACTOS, FOSAMAX AND DIOVAN.   MEDICATIONS:  1.  Synthroid 112 mcg daily.  2.  Lasix 20 mg to 40 mg a few times per week.  3.  Zetia 10 mg daily.  4.  Protonix 40 mg b.i.d.  5.  Lopid 600 mg b.i.d.  6.  Glucovance 2.5/500, two q.a.m. and one q.p.m.  7.  Calcium daily.  8.  Vitamin D 50,000 units q. Weekly.  9.  Lanoxin 0.125 mg daily.  10. Neurontin 300  mg t.i.d.  11. Indomethacin 25 mg p.o. b.i.d. p.r.n.  12. Prednisone 7.5 mg daily.  13. Miacalcin one spray in alternating nostrils daily.  14. Vicodin p.r.n.  15. Iron sulfate 325 mg daily.   REVIEW OF SYSTEMS:  Please see the history of present illness.  She does not  cite any other medical issues besides what is stated there.  All other organ  system reviewed negative.   PHYSICAL EXAMINATION:  VITAL SIGNS:  Blood pressure 115/72, temperature 97  degrees, heart rate 95, respirations 20, saturation 94% on room air.  GENERAL:  Alert and oriented x3, in no acute distress.  LUNGS:  Clear to auscultation bilaterally.  HEART:  Regular.  HEENT:  Ears/Nose/Throat/Oropharynx:  Dry.  NECK:  No jugular venous distention.  ABDOMEN:  Distended.  Diffusely mildly tender.  Increased bowel sounds.  No  rebound or guarding.  EXTREMITIES:  No clubbing or cyanosis.  No edema.   LABORATORY DATA:  Ancillary did a urine microscopy:  With 3-6 white blood  cells per high power field.  Urinalysis with moderate leukocytes.  Digoxin  level 0.6.  Sodium 134, potassium 4.8, chloride 100, bicarbonate 23, BUN 69, creatinine 1.9, glucose 136.  White count 21.5, hemoglobin 11.5, platelet   count 362.  Liver function tests within normal limits except for the albumin  of 2.6.   ASSESSMENT:  This is an elderly female with multiple medical problems, who  has had loose bowel movements since discharge for diverticulitis.  Had  worsening in the last week with her  diarrhea, and especially the last two  days.  Now dehydrated with azotemia and leukocytosis, being admitted for  further evaluation and treatment.   PLAN:  1.  Admit.  2.  Hydrate.  3.  Lomotil.  4.  She is status post recent Cipro and Flagyl.  Hold antibiotics for now.  5.  Stool studies.  6.  KUB.  7.  Get GI to see the patient.  8.  Medications effecting GI have been changed or stopped, i.e. the      Glucophage, Lopid and iron.  9.  GI to evaluate with a questionable flexible sigmoidoscopy.  She did have      heme-positive stools on the last visit and needed packed red blood      cells, and continues to have lower GI colon issues.  10. Continue prednisone for rheumatologic disorder, until she can seen Dr.      Phylliss Bob as an outpatient.  Will consider      ___________.  11. Correct azotemia.  12. Follow up labs in the morning.      Gwen Pounds, MD  Electronically Signed     JMR/MEDQ  D:  07/25/2005  T:  07/27/2005  Job:  [id number]   cc:   Bernette Redbird, M.D.  Fax: [phone]   Excell Seltzer. [name], M.D.  Fax: [phone]   Colleen Can. Deborah Chalk, M.D.  Fax: [phone]   Gretta Cool, M.D.  Fax: [phone]   Areatha Keas, M.D.  Fax: [phone]

## 2010-12-27 NOTE — Op Note (Signed)
NAMEHUDSON, [name]                ACCOUNT NO.:  [id number]   MEDICAL RECORD NO.:  [id number]          PATIENT TYPE:  AMB   LOCATION:  ENDO                         FACILITY:  [facility]   PHYSICIAN:  Bernette Redbird, M.D.   DATE OF BIRTH:  Sep 08, 1930   DATE OF PROCEDURE:  10/07/2005  DATE OF DISCHARGE:                                 OPERATIVE REPORT   PROCEDURE:  Colonoscopy with biopsies.   ENDOSCOPIST:  Bernette Redbird, M.D.   INDICATIONS:  [age]-year-old female with transiently Hemoccult-  positive stool and severe diarrhea in the setting of longstanding diabetes,  but also previous antibiotic therapy.   FINDINGS:  Grossly normal exam to the cecum.  Moderate amount of semi-formed  stool present.   PROCEDURE:  The nature, purpose and risks of the procedure were familiar to  the patient from prior examination.  She provided written consent.  Sedation  was fentanyl 100 mcg and Versed 10 mg IV, without arrhythmias or  desaturation.  The procedure was intentionally done unprepped, in view of  her history of diarrhea; however, the patient indicates that in recent days,  the diarrhea has pretty well resolved.   Nonetheless, I was able to perform this procedure by advancing the scope  through a generous amount of semiformed stool in the sigmoid region with the  help of irrigation and suctioning, and then around the colon to the cecal  region as identified by visualization of the ileal cecal valve and  transillumination of the right lower quadrant.  Pullback was then performed.   There was quite a bit of residual stool present; consequently, small or  focal abnormalities would very possibly be missed during this exam.  However, the purpose of this exam was primarily to look for evidence of  colitis, such as pseudomembranous colitis, ischemic colitis, etc.  Random  mucosal biopsies were obtained along the length of the colon to help exclude  collagenous colitis or microscopic  colitis.   I did not see any polyps, cancer, vascular ectasia, pseudomembranes,  diverticular disease or any source of diarrhea.  I did not retroflex in the  rectum.   The patient tolerated the procedure well and there were no apparent  complications.   IMPRESSION:  Grossly normal unprepped colonoscopy to the cecal region.   PLAN:  Await pathology on random biopsies.  If they are normal, that would  pretty well exclude the possibility of microscopic colitis or cholangitis  colitis as the source of the patient's symptoms, and would make it more  likely that this is diabetic diarrhea, which would be compatible with the  history of nocturnal predilection for symptoms.  In that case, clonidine in  low doses might be an appropriate medication to try.           ______________________________  Bernette Redbird, M.D.     RB/MEDQ  D:  10/07/2005  T:  10/08/2005  Job:  [id number]   cc:   Gwen Pounds, MD  Fax: [phone]   Colleen Can. Deborah Chalk, M.D.  Fax: [phone]

## 2010-12-27 NOTE — Discharge Summary (Signed)
[name], [name]                ACCOUNT NO.:  [id number]   MEDICAL RECORD NO.:  [id number]          PATIENT TYPE:  INP   LOCATION:  6525                         FACILITY:  [facility]   PHYSICIAN:  Colleen Can. Deborah Chalk, M.D.DATE OF BIRTH:  19-Oct-1930   DATE OF ADMISSION:  10/19/2005  DATE OF DISCHARGE:  10/21/2005                                 DISCHARGE SUMMARY   PRIMARY DISCHARGE DIAGNOSIS:  Nausea and shortness of breath, now resolved.   SECONDARY DISCHARGE DIAGNOSES:  1.  Hyperlipidemia.  2.  Remote history of pericarditis dating back to 95.  3.  Insulin-dependent diabetes.  4.  Remote history of catheterization in 1996 showing normal coronaries.  5.  Obesity.  6.  History of elevated sedimentation rate.  7.  Diarrhea.  8.  History of leukocytosis.  9.  Hypothyroidism.  10. Diabetic neuropathy.  11. Fibromyalgia.  12. History of a nonspecific autoimmune disorder.  13. History of pancreatitis.  14. History of microalbuminuria.  15. History of gout.  16. History of osteoporosis with pubic ramus fractures in the past.  17. Known diastolic dysfunction.  18. History of bladder tack.  19. History of diverticulosis/diverticulitis.  20. History of anemia.  21. History of azotemia secondary to dehydration.  22. History of urinary tract infections.  23. Vitamin D deficiency.   HISTORY OF PRESENT ILLNESS:  The patient is a 77 year old white female who  was admitted for evaluation of nausea.  She had had a two-day history of  continuous nausea that resulted in a reduced appetite.  She had no actual  abdominal pain, vomiting, diarrhea, hematemesis, melena, etc.  She had had  no fever or chills, and she denied chest pain.  She did have some mild  shortness of breath prior to the weekend, but that had basically resolved.  She had also noted some mild ankle edema.  She was subsequently seen and  evaluated and admitted for observation.   Please see the history and physical per  [name], M.D., for  further patient presentation and profile.   LABORATORY DATA:  EKG showed incomplete right bundle branch block with  nonspecific ST findings.  Chest x-ray showed cardiomegaly with no evidence  of edema.  Potassium was 3.9, BUN 18, creatinine 1.3.  cardiac enzymes were  negative.  Hemoglobin was 11.1, hematocrit of 32. BNP was less than 30.  Amylase and lipase were negative.   It should be noted she had her last digoxin level that was 1.0 on August 27, 2005.   HOSPITAL COURSE:  The patient was admitted by Dr. Lemmie Evens.  She  basically ruled out negative for cardiac involvement for serial enzymes.  BNP showed no evidence of frank heart failure.  Her home medicines were  continued.  She was seen in consultation by both Bernette Redbird, M.D., as  well as Gwen Pounds, M.D.  Apparently she has had a recent colonoscopy and  during this admission underwent EGD, which showed a small to moderate amount  of bile reflux, which may account for her nausea.  There was minimal foci or  antral gastritis secondary to aspirin.  There was no source of anemia that  was apparent.   Today on October 21, 2005, she is basically doing well and she is now status  post EGD, and plans were made for her to be discharged later on this  afternoon with her home medicines as well as with reinitiation of  hydrochlorothiazide as well as a trial of Carafate.  It should be noted, the  patient has recently had a negative Cardiolite study on February 7 of this  year, which showed a normal ejection fraction with no evidence of ischemia.  A 2-D echocardiogram in our office performed in May of this year showed mild  LVH with diastolic dysfunction and mild MR, moderate TR and mitral annular  calcification.  The 2-D echocardiogram has been repeated during this  admission and is felt to be unchanged.   DISCHARGE CONDITION:  Stable.   DISCHARGE MEDICATIONS:  1.  Lasix 40 mg a day.  2.   Zetia 10 mg a day.  3.  Synthroid 112 mcg daily.  4.  Protonix 40 mg q day.  5.  Multivitamin daily.  6.  Calcium with D daily.  7.  Lanoxin 0.125 mg daily.  8.  Her insulin will be as before.  9.  Prednisone 5 mg a day as before.  10. Neurontin 300 mg t.i.d.  11. We will be restarting hydrochlorothiazide at 25 mg one-half tablet      daily.  12. We will also do a trial of Carafate.   We will ask her to follow up with Dr. Timothy Lasso in one week and Dr. Matthias Hughs in  two weeks.  She will keep her regular scheduled appointment with Dr. Deborah Chalk  as already arranged.  She is to call if any problems arise in the interim.   Her diet is to be heart-healthy.   Her activity is to be without restrictions.      [name], P.A. [facility]      [employer]. Deborah Chalk, M.D.  Electronically Signed    EW/MEDQ  D:  10/21/2005  T:  10/22/2005  Job:  [id number]   cc:   Gwen Pounds, MD  Fax: [phone]   Bernette Redbird, M.D.  Fax: [phone]

## 2010-12-27 NOTE — H&P (Signed)
[name], [name]:  [id number]   MEDICAL RECORD NO.:  [id number]          PATIENT TYPE:  EMS   LOCATION:  MAJO                         FACILITY:  [facility]   PHYSICIAN:  Gwen Pounds, MD       DATE OF BIRTH:  04-11-1931   DATE OF ADMISSION:  05/19/2006  DATE OF DISCHARGE:                                HISTORY & PHYSICAL   PRIMARY CARE [name]:  Dr. Timothy Lasso.   UROLOGIST:  Dr. Annabell Howells.   CARDIOLOGIST:  Dr. Deborah Chalk.   GI:  Dr. Matthias Hughs.   GYNECOLOGIST:  Dr. Nicholas Lose.   NEUROLOGIST:  Dr. Thad Ranger.   RHEUMATOLOGY:  Dr. Phylliss Bob.   CHIEF COMPLAINT:  Weakness, failure to thrive, dehydration.   HISTORY OF PRESENT ILLNESS:  This is a 77 year old female with multiple  medical problems, who saw me yesterday and has been dealing with several  issues, including fatigue, failure to thrive, difficulty with gait falling  to the left and also report falling to the right, anorexia a 5 pound weight  loss and decreased fluid consumption, as well as dropping things out of both  hands, right greater than left.  She also reports being dizzy.  She had a  busy day yesterday.  Besides seeing me, also had her eyes worked on per Dr.  Ashley Royalty and she underwent laser treatment and she also had a mammogram.  She was going to follow up with Dr. Ashley Royalty on October 15.  [name]  stopped her hydrochlorothiazide on October 4, because she thought it was  negatively affecting her.  Her physical exam was nonspecific when I saw her  yesterday.  She looked fairly well, despite all of her medical complaints.  I had her drink two, 8 ounce glasses of water in front of me.  We reviewed  her recent lab work and went over the differential diagnoses of what might  be going on with her.  I thought that there was some anxiety component and  some dehydration.  Because of no orthostatic blood pressure changes, I  elected to check her labs, have her go home and push fluids.  I also told  her to take an  extra dose of prednisone yesterday and today.  Today, she did  worse.  She dropped more things.  There is some report of some slow speech  and forgetting what she is talking about mid sentence, but no slurring and  no true aphagia.  I told her to go to the ED for evaluation and treatment  and possible admission.  I wanted a cranial CT to rule out CVA or TIAs.   PAST MEDICAL HISTORY:  1. Diabetes mellitus type 2.  2. Hypothyroidism.  3. Hyperlipidemia.  4. Hypertriglyceridemia.  5. Diabetic polyneuropathy.  6. Fibromyalgia.  7. History of pericarditis.  8. History of elevated sed rate with a questionable diagnosis of lupus,      but it has been determined to be nonspecific autoimmune disorder.  9. History of ____________.  10.History of microalbuminuria.  11.Gout.  12.Known leukocytosis.  13.History of acute renal failure from prior  admission, that is now      resolved.  14.Hypokalemia.  15.History of severe osteoporosis.  16.History of right inferior pubic ramus stress fracture in January of      2006.  17.History of appendectomy, known [facility].  18.History of gastritis.  19.History of left and right cataract surgeries.  20.History of peripheral visual field deficit in November of 2003.  21.History of polypectomy in November of 2003.  22.History of numerous upper and lower endoscopies.  23.Anemia with negative GI and negative heme workup.   ALLERGIES:  INCLUDE INTOLERANCES TO:  1. ALTACE.  2. DIOVAN.  3. HYZAAR.  4. ACTOS.  5. FOSAMAX.  6. SHE REPORTS ALWAYS NEEDING NAME BRAND OF EVERY MEDICATION THAT SHE      GETS, ALTHOUGH I HAVE NO KNOWN REASON WHY SHE COULD NOT TOLERATE      GENERICS.   MEDICATIONS:  1. Prednisone 5 mg p.o. daily.  2. Protonix 40 daily.  3. Calcium and vitamin D.  4. Synthroid 112 daily.  5. Vitamin D 55 units p.o. weekly.  6. Lasix 20 mg one to two tablets per week as needed.  7. Glucovance 2.5/500 b.i.d.  8. Fish oil 1000 daily.  9. Lipidil 600  b.i.d.  10.Neurontin 300 mg up to q.i.d.  11.Lanoxin 0.125 daily.  12.Zetia 10 daily.   SOCIAL HISTORY:  She is widowed, has 4 children and 2 grandchildren.  She is  retired.  No tobacco and no alcohol.   FAMILY HISTORY:  Stroke, congestive heart failure and diabetes.   REVIEW OF SYSTEMS:  She denies any chest pain and shortness of breath, bowel  issues, urinary issues.  Has an overall positive review of systems and she  reports being hungry, although she cannot tolerate consuming.   PHYSICAL EXAMINATION:  VITAL SIGNS:  Temperature 98.1.  Blood pressure  143/78 on arrival, 117/60 on recheck.  Respiratory rate 18.  Heart rate 81,  96% saturations on room air.  Blood sugars are 278.  GENERAL:  Alert and oriented x3.  HEENT:  Oropharynx is dry.  Cranial nerves II-XII grossly intact.  No bruits  heard.  No JVD.  PULMONARY:  Clear to auscultation bilaterally.  CARDIAC:  Regular with a 1/6 systolic ejection murmur.  ABDOMEN:  Soft, nontender, nondistended.  Positive bowel sounds.  EXTREMITIES:  No clubbing, no cyanosis, no edema.  NEUROLOGIC:  Her strength is fine.  She is moving all 4s.  DTRs are 2+  throughout.  She has got a very nonfocal neurologic exam.  She does have  some global weakness when you try to do stuff with her.   ANCILLARY DATA:  She had a 2D echocardiogram per Dr. Deborah Chalk on September 11, 2005, EF was 55% to 60%, mild LVH, impaired diastolic dysfunction, mild MR.  A stress test was done in November of 2004, which was negative.  Labs, done  in my office on May 05, 2006, showed a sodium of 137, potassium 4.4,  chloride 96, bicarb 24, BUN 42, creatinine 1.5, glucose 251.  LFTs were  within normal limits.  LDL particle number was 1567, LDL size was 1338.  White blood cell count was 14.8, hemoglobin 12.9, platelet count 255.  Total  cholesterol was 179, triglycerides 461, HDL 27, sed rate of 52, 25 hydroxy vitamin  D was 53.1, A1c was 6.5.  Labs, on March 05, 2006,  showed TSH 0.505.  EKG today shows sinus tachycardia and LVH.  Cranial CT and labs from today  in the  emergency room is currently pending.   ASSESSMENT:  This is a 77 year old female with multiple medical problems and  multiple nondescript complaints, being admitted with failure to thrive,  weakness, gait disturbance and dehydration.  Low probability for transient  ischemic attack and cerebrovascular accident, but I believe this really  needs to be worked up because some symptoms are actually consistent, but  transient ischemic attack or cerebrovascular accident does not explain all  of her symptoms.   PLAN:  1. Admit.  2. Fluids for dehydration.  3. Continue prednisone at her baseline dose, which is 5 mg.  4. Check cranial CT and check an MRI and MRA in the morning, unless the CT      scan shows something specific.  Rule out TIA and CVA as discussed      above.  5. Control anxiety.  6. Manage her medicines.  7. Patient has known polyneuropathy and this may be a medication effect or      neuropathic effect.  Continue the Neurontin at this point, but only do      three times a day and follow.  8. The patient will need physical and occupational therapy.  9. We will consider neurologic input.  10.We will follow labs.  11.We will place her on home medications.  12.May need to repeat 2D echo if determined necessary an she may also need      carotid Dopplers if felt that we are      continuing to pursue neurologic workup.  13.Insulin sliding scale.  14.We will place her on DVT prophylaxis if the cranial CT shows no      intracranial bleed.      Gwen Pounds, MD  Electronically Signed     JMR/MEDQ  D:  05/19/2006  T:  05/20/2006  Job:  [id number]   cc:   Gwen Pounds, MD  Excell Seltzer. Annabell Howells, M.D.  Colleen Can. Deborah Chalk, M.D.  Bernette Redbird, M.D.  Gretta Cool, M.D.  Michael L. Thad Ranger, M.D.  Areatha Keas, M.D.

## 2010-12-27 NOTE — Discharge Summary (Signed)
[name] NO.:  [id number]   MEDICAL RECORD NO.:  [id number]          PATIENT TYPE:  INP   LOCATION:  5522                         FACILITY:  [facility]   PHYSICIAN:  Gwen Pounds, MD       DATE OF BIRTH:  08/05/1931   DATE OF ADMISSION:  06/28/2005  DATE OF DISCHARGE:  07/06/2005                                 DISCHARGE SUMMARY   DISCHARGE ADDENDUM   Please see Discharge Summary dated July 04, 2005.  In brief, [name] was admitted to the hospital with dehydration, azotemia, severe  hypoglycemia, urinary tract infection, leukocytosis, hypercalcemia, and  altered mental status.  She received adequate and appropriate therapy and  was ready for discharge on July 04, 2005.  Unfortunately, she went to  the bathroom and had explosive diarrhea concerning for C.difficile colitis,  and we decided to keep her for an extra day while we were running the tests.  Her C.difficile colitis toxin came back negative x2.  The next morning on  rounds, we discussed going home, but it was determined on November 25th that  this was not going to be able to happen due to an acute left wrist gouty  arthritis flare where her wrist was very swollen, tender, warm, and she was  unable to move her wrist in any direction.  After careful consideration, she  was given 20 mg of oral prednisone, indomethacin 25 mg x1 and b.i.d., and  Colchicine 0.6 mg x1.  An x-ray was obtained, and the x-ray showed  osteopenia and radial side degenerative changes and some mild soft tissue  swelling.  There were no signs or symptoms to suggest that this was a septic  arthritic wrist.  With the above-mentioned treatment, she improved  dramatically by the following morning.   Her hemoglobin and anemia continued to be an issue for the next day or so.  On the 25th of November, she had a hemoglobin down to 8.9 and considering  that she was staying in the hospital for another day for the gouty  arthritis  and that she was feeling a little weak, it was determined that giving her  one unit of packed red blood cells wound be appropriate.  The risks and  benefits of this were discussed, and she was transfused.  The following  morning on rounds on the 26th, her hemoglobin went from 8.9 and was again  8.9.  She did have a large hematoma/bruise on her left lower quadrant due to  the Lovenox she had been receiving, and I decided that a retroperitoneal  bleed was in the differential diagnosis.  Therefore, a CT scan of the  abdomen and pelvis without contrast was obtained, which was negative for any  acute hemorrhage.  There was some cardiomegaly, and a few small hemorrhagic  right renal cysts were noted.  There were also two small anterior  subcutaneous hematomas on the left.  No intraperitoneal or intracranial  hemorrhage.  Old right inferior and superior pubic ramus fractures were  noted.  With this information, a repeat CBC was  obtained, which showed that  her hemoglobin was 10.0, white count was still elevated at 13.0, but the  results could be attributed from all the steroids that she had been getting,  and platelet count was 299.  She has been able to use her wrist.  She has  been able to grab things.  There is still some mild swelling and edema but  markedly improved from yesterday.  She is now ready for discharge.  Differences in her medication list include Amaryl 4 mg in the morning, 2 mg  in the p.m. for diabetes and Indomethacin 25 mg b.i.d. as needed.  She is to  finish her Cipro on the 28th of November and finish her Flagyl for four more  days, and she is to continue to stay off of the Microzide and the  Glucovance.  She will follow up with me on Wednesday of this week.  She did  receive an Aranesp dose prior to discharge.      Gwen Pounds, MD  Electronically Signed     JMR/MEDQ  D:  07/06/2005  T:  07/06/2005  Job:  [id number]   cc:   Bernette Redbird, M.D.  Fax:  [phone]   Areatha Keas, M.D.  Fax: [phone]

## 2010-12-27 NOTE — Op Note (Signed)
   NAME:  [name], [name]                          ACCOUNT NO.:  [id number]   MEDICAL RECORD NO.:  [id number]                   PATIENT TYPE:  AMB   LOCATION:  ENDO                                 FACILITY:  [facility]   PHYSICIAN:  Bernette Redbird, M.D.                DATE OF BIRTH:  March 23, 1931   DATE OF PROCEDURE:  07/06/2002  DATE OF DISCHARGE:                                 OPERATIVE REPORT   PROCEDURE PERFORMED:  Upper endoscopy with biopsies.   INDICATIONS FOR PROCEDURE:  Unexplained nausea in a 77 year old female.  This symptom has been improving recently.  In addition, there is a history  of an abnormal radiographic appearance of the pancreas but her CA19-9 level  came back normal.   FINDINGS:  Normal exam.   DESCRIPTION OF PROCEDURE:  The patient provided written consent for the  procedure.  The Olympus small caliber adult video endoscope was passed under  direct vision.  There appeared to be some slight inflammation of the  posterior commissure adjacent to the left true vocal cord but no nodularity  or evidence of severe inflammation was seen.  The esophagus was easily  entered under direct vision and had entirely normal mucosa without evidence  of reflux esophagitis, Barrett's esophagus, varices, infection, neoplasia or  any ring, stricture or hiatal hernia.   The stomach contained minimal clear residual which was suctioned out.  The  gastric mucosa was normal, specifically without evidence of gastritis,  erosions, ulcers, polyps or masses, including a retroflex view of the cardia  of the stomach which was normal.  I could not observe gastric mobility  during this exam.   The pylorus, duodenal bulb and second duodenum looked normal.   Prior to removal of the scope, I obtain random mucosal biopsies from the  duodenum and the antrum of the stomach in view of the patient's symptoms and  her coexisting abdominal pain.  The patient tolerated the procedure well and  there were  no apparent complications.   IMPRESSION:  Normal exam.  No source of previous nausea identified. No  obvious intrusion on the stomach to correlate with the questionably abnormal  fullness of the pancreas on her radiographic study.   PLAN:  Await pathology results.  Proceed to screening colonoscopy.                                               Bernette Redbird, M.D.   RB/MEDQ  D:  07/06/2002  T:  07/06/2002  Job:  [id number]   cc:   Gwen Pounds, M.D.  [address]  [place]  [place] [address]  Fax: [phone]

## 2010-12-27 NOTE — Consult Note (Signed)
[facility]  Patient:    [name], [name] Visit Number: [id number] MRN: [id number]          Service Type: FTC Location: FOOT Attending Physician:  Sharren Bridge Dictated by:   Jonelle Sports Cheryll Cockayne, M.D. Proc. Date: 12/30/01 Admit Date:  12/30/2001   CC:         Creola Corn, M.D.   Consultation Report  HISTORY OF PRESENT ILLNESS:  This 77 year old white female with longstanding type 2 diabetes and peripheral neuropathy is seen for evaluation of swelling and discoloration of the right foot, and also of a traumatic eschar on the left ankle area.  In addition to her diabetes, the patient has some degree of chronic venous insufficiency with numerous superficial varicosities and evidence of chronic perforator vein problems as well.  This has never resulted in ulceration or other significant problems.  It was about a month ago that the patient sustained trauma from rubbing from a boot on the lateral malleolar area of her left foot, which created a blister which went on into form a hardened eschar and has persisted to the present. There has never been any secondary infection that she is aware of, although there has been some occasional shooting pain in that area.  In addition, the patient reports that approximately one week ago she developed a large bruised area on the dorsal aspect of her right foot down to the base of the toes, which has gradually spread over onto the lateral foot and into the dependent areas there.  This had been associated with some degree of swelling and primarily the discomfort of swelling but no frank pain.  She is totally unaware of turning her ankle or any trauma.  She has had no awareness of anything snapping in the ankle area (such as rupture of a plantaris tendon).  She is here today for our evaluation and advice of both these problems.  It bears mention that her diabetes is apparently quite well controlled with  a recent hemoglobin A1c of 4.6%.  PAST MEDICAL HISTORY: 1. Type 2 diabetes mellitus. 2. Hypertension. 3. Congestive heart failure. 4. Hypothyroidism.  ALLERGIES:  No known medicinal allergies.  REGULAR MEDICATIONS:  Enteric-coated aspirin, Synthroid, Lanoxin, Glucophage, Protegra, Lasix, Microzide (which is hydrochlorothiazide), and Neurontin.  PHYSICAL EXAMINATION:  Limited to the distal lower extremities.  DISTAL LOWER EXTREMITIES:  The feet are without gross deformity although there is a mild degree of soft tissue swelling involving the dorsal aspect of the right foot.  Skin temperatures are in the low normal range and are approximately 2 degrees higher in the area of bruising on the right foot than on the left.  There is no gross foot deformity.  The nail of the right hallux has been previously surgically removed.  All pulses in her feet are clearly palpable and adequate.  Monofilament testing shows that she lacks protective sensation throughout both feet.  She has evidence for chronic venous insufficiency and also with many superficial spider-type veins of both distal lower extremities, greater on the left than on the right.  In the left lateral malleolar area there is a dumbbell-shaped eschar measuring 1.2 x 1.6 cm.  DISPOSITION: 1. The patient is given general instruction regarding foot care and diabetes    by video with nurse reinforcement. 2. The patient reveals that she has had an x-ray done of that foot last week    which was said to show no fracture. 3. The eschar on the left lower extremity  is sharply pared away revealing an    unhealed central ulcerated area that measures approximately 0.5 x 0.2 cm. 4. That area is then covered with DuoDerm and she is given replacement patches    to change at five day intervals for a total of two weeks or so.  After    that, she is advised to simply use a Band-Aid on the area. 5. The process in the right foot appears to be one of  some internal bleeding,    likely from some unrecognized trauma or from a spontaneous rupture of a    small vein.  I cannot find clinical evidence of rupture of plantaris tendon    or anything of that nature, although with dissection there is blood    distalward is somewhat characteristic for what one would expect in one of    those situations.  It is explained to the patient that despite the negative    x-rays she conceivably could have a stress fracture, and that if pain    continues, swelling continues, or discoloration secondary or worsens, she    should be seen again and re-x-rayed. 6. In the meanwhile, she is given a prescription for knee-length 20-30    compression hose with open toes and advised to wear these.  She will be    allowed to return to her regular shoes as soon as the swelling allows.  But    in the interim, she is to remain in the healing sandal on the right which    she already has. 7. Despite the patients insensate state in her feet, she has no significant    plantar calluses and does not appear to have a need for special orthotics    or footwear at this point. 8. Followup visit will be here on a p.r.n. basis. Dictated by:   Jonelle Sports Cheryll Cockayne, M.D. Attending Physician:  Sharren Bridge DD:  12/30/01 TD:  01/01/02 Job: [id number]

## 2010-12-27 NOTE — Consult Note (Signed)
[name], KILCREASE NO.:  [id number]   MEDICAL RECORD NO.:  [id number]          PATIENT TYPE:  INP   LOCATION:  6525                         FACILITY:  [facility]   PHYSICIAN:  Bernette Redbird, M.D.   DATE OF BIRTH:  06/16/1931   DATE OF CONSULTATION:  10/20/2005  DATE OF DISCHARGE:                                   CONSULTATION   Dr. Timothy Lasso asked me see this pleasant 77 year old female because of nausea.   [name] was admitted yesterday evening to the hospital on observation  status because of nausea. The cause of the nausea is not clear. She began to  feel nauseated approximately 72 hours ago but has not vomited and is able to  eat small amounts, although she does not feel like it.   Of note, the patient was seen recently by me for severe diarrhea which has  subsequently resolved. This prompted colonoscopy by me approximately two  weeks ago which was basically normal except that random mucosal biopsies  showed evidence of lymphocytic colitis. Our intent had been to start the  patient on budesonide and for her to follow-up with me in the office but she  never got around to picking up samples or actually starting on the  medication.   There is been no recent change in medications, but of note, the patient is  on multiple ulcerogenic medications such as Indocin 25 milligrams twice  daily, daily baby aspirin and prednisone. However, she is on Protonix 40  milligrams twice daily. She is not on any bisphosphonate therapy.   PAST MEDICAL HISTORY:  No known allergies.   OUTPATIENT MEDICATIONS:  Furosemide, Synthroid, prednisone, Xanax, digoxin,  indomethacin, Zetia, Protonix, Neurontin vitamin D.   OPERATIONS:  Remote appendectomy and hysterectomy.   MEDICAL ILLNESSES:  Diabetes approximately 15 years duration and also  nonspecific connective tissue disorder for which she is on the above-  mentioned prednisone. There is also history of hypothyroidism,  fibromyalgia,  gout, anemia, hyperlipidemia, osteoporosis and possibly a remote history of  pancreatitis.   FAMILY HISTORY:  Negative for GI illnesses.   SOCIAL HISTORY:  Not obtained.   REVIEW OF SYSTEMS:  The patient has not had abdominal pain and her diarrhea  has resolved.   PHYSICAL EXAM:  GENERAL: A pleasant, perhaps slightly depressed-appearing  female in no acute distress anicteric. No evident pallor.  CHEST: Clear.  HEART: Unremarkable.  ABDOMEN: Quiet without succussion, splash, guarding, mass or tenderness.   LABS:  Admission white count 13,700. Hemoglobin 11.1, platelets 257,000.  Differential shows 70 polys, 20 lymphs, 7 monocytes, 2 eosinophils. INR  normal. Chemistry panel unremarkable with glucose 105, BUN 19, creatinine  1.2. Liver chemistries normal. Albumin 3.7 with mildly elevated total  protein of 8.8. CK normal.   IMPRESSION:  1.  Low grade nausea of several days' duration.  2.  History of diarrhea with biopsy-proven lymphocytic colitis, now      symptomatically resolved.  3.  Longstanding diabetes and multiple other medical issues.   DISCUSSION:  The differential diagnosis of the nausea would include an  infectious cause  such as of viral gastritis, medication side-effects such as  Indocin causing ulcer disease, drug-induced nausea (although no recent  change in medications to account for that), or diabetic gastroparesis.   PLAN:  1.  Trial of sucralfate for the nausea.  2.  Endoscopic evaluation in the morning  3.  If the nausea persist despite sucralfate and the endoscopy is      unrevealing, I would favor a gastric emptying scan which could be done      electively as an outpatient. Note, that one was performed about three      and a half years ago and was negative at that time.   I appreciate the opportunity to have seen this patient in consultation with  you.           ______________________________  Bernette Redbird, M.D.     RB/MEDQ  D:   10/20/2005  T:  10/20/2005  Job:  [id number]   cc:   Gwen Pounds, MD  Fax: [phone]   Colleen Can. Deborah Chalk, M.D.  Fax: [phone]   Areatha Keas, M.D.  Fax: [phone]

## 2010-12-27 NOTE — Consult Note (Signed)
NAME:  [name], [name]                          ACCOUNT NO.:  [id number]   MEDICAL RECORD NO.:  [id number]                   PATIENT TYPE:  EMS   LOCATION:  MINO                                 FACILITY:  [facility]   PHYSICIAN:  Gwen Pounds, M.D.                 DATE OF BIRTH:  12-Dec-1930   DATE OF CONSULTATION:  05/06/2002  DATE OF DISCHARGE:                                   CONSULTATION   CHIEF COMPLAINT:  Abdominal pain, nausea and vomiting.   HISTORY OF PRESENT ILLNESS:  This is a 77 year old white female with type 2  diabetes mellitus, hypothyroid, hyperlipidemia, diabetic neuropathy,  fibromyalgia, anemia and history of pancreatitis, sent to the ED with five  days of nausea, vomiting, white chalky stools, fevers, chills and abdominal  pain, abdominal pain described as midepigastric/right upper quadrant, and  back pain.  At the ED, all vital signs were stable, labs were fine, no  pancreatitis by amylase and lipase, no urinary tract infection by  urinalysis.  Abdominal ultrasound was negative except for mild fatty liver.  CT of the abdomen showed scattered sub-centimeter lymph nodes,  retroperitoneal, and a questionable fullness at the pancreatic head, ? mass.  They suggested MR for further evaluation.  The CT scan also showed some  aortic atherosclerosis.  The pelvis was visibly normal.  In the ED, she was  given morphine, Phenergan and IV fluids.  She was initially sent to the ED  by me from our office yesterday afternoon.  At around 1 o'clock in the  morning, Dr. Carleene Cooper called Dr. Lennon Alstrom. Avva,  who was on call for our  group, who left verbal admission orders.  The patient still is in the  emergency department as of 7:30 in the morning when I saw her.  After  getting IV fluids, morphine and Phenergan, she was feeling better.  At this  point, it was decided that she may not need inpatient hospitalization, based  on the fact that there are no acute abnormalities and she  does not have a  surgical abdomen.  She was given some food; she tolerated it much better  than she had over the prior five days.  She had mild nausea.  She was  physically feeling better and was up to going home.   PAST MEDICAL HISTORY:  Past medical history includes diabetes mellitus, type  2, hypothyroid, hyperlipidemia, diabetic neuropathy, fibromyalgia, history  of pericarditis, questionable history of systemic lupus erythematosus,  history of pancreatitis, ejection fraction of 50-60%, status post bladder  tack operation, history of right cataract operation in March 2003 and  history of appendectomy.   MEDICATIONS:  1. Glucovance 2.5/500 mg b.i.d.  2. Synthroid 0.112 every day.  3. Lanoxin 0.25 mg every day.  4. Lasix 40 mg every day.  5. Darvocet p.r.n.  6. Zostrix cream p.r.n.  7. Zetia 10 every day.  8. Lopid 600 mg b.i.d.   SOCIAL HISTORY:  She lives alone.  She is retired.  She is widowed.  Four  children.  Two grandchildren.  Two sons.  No tobacco.  No alcohol.   FAMILY HISTORY:  CVA, congestive heart failure, diabetes.   ALLERGIES:  Allergies seem to be ACE INHIBITORS and ANGIOTENSIN II RECEPTOR  BLOCKERS.   REVIEW OF SYSTEMS:  She stopped Glucovance when she became ill.  She had no  nausea and vomiting overnight.  She had no chest pain, shortness of breath,  dyspnea on exertion or orthopnea.  No PND.  No melena, hematemesis, GERD or  other symptoms from head to toe besides what is described in the HPI.   PHYSICAL EXAMINATION:  VITAL SIGNS:  Temperature 97.0, 151/71, heart rate  74, respiratory rate 20, saturating 96% on room air.  GENERAL:  Alert and oriented x 3, in no acute distress.  HEENT:  Oropharynx clear, dry.  LYMPH NODES:  No lymphadenopathy.  CARDIOVASCULAR:  Regular rhythm without murmurs, rubs, or gallops.  PULMONARY:  Clear to auscultation bilaterally.  ABDOMEN:  Abdomen soft, nontender and nondistended.  Bowel sounds positive  x4.  No masses.   Benign abdomen otherwise appreciated.  EXTREMITIES:  No clubbing.  No cyanosis.  Trace edema.   LABORATORY DATA:  Laboratory data shows BMET within normal limits with a  creatinine of 1.1.  LFTs were within normal limits.  Hemoglobin was 12.2,  white blood cell count was 10.1, platelet count 292,000.  Amylase was 70,  lipase was 28.  Urinalysis negative.   ASSESSMENT:  This is a 77 year old female with multiple medical problems, in  the emergency room currently with nausea and vomiting, change in stools and  abdominal pain improving with the intravenous fluids.  CT scan suggested  pancreatic mass, although not seen on her last outpatient CT scan.   PLAN:  1. The patient is clinically doing better and will be discharged to home on     p.r.n. Reglan and told to drink plenty of fluids.  2. Rest of the workup can be done as an outpatient.  3. Patient again able to tolerate p.o.'s with mild nausea but better with     hydration.  Again, discharge on Reglan.  4. MRI ordered for Monday as outpatient; ________ from my office will set     this up and call the patient.  5. The patient will follow up with me on Tuesday.  6. No acute abdominal issues requiring admission.  7. The patient is instructed to continue holding Glucovance until she sees     me again and to continue holding most of her medications except for the     Synthroid until she sees me again.                                                                Gwen Pounds, M.D.    JMR/MEDQ  D:  05/06/2002  T:  05/06/2002  Job:  [id number]   cc:   Fax #[phone] Gwen Pounds, M.D.   Excell Seltzer. Annabell Howells, M.D.   Colleen Can. Deborah Chalk, M.D.  [address]  [place]  [place] [address]  Fax: [phone]

## 2011-01-04 ENCOUNTER — Emergency Department (HOSPITAL_COMMUNITY): Payer: Medicare Other

## 2011-01-04 ENCOUNTER — Emergency Department (HOSPITAL_COMMUNITY)
Admission: EM | Admit: 2011-01-04 | Discharge: 2011-01-05 | Disposition: A | Discharge status: Home / Self Care | Payer: Medicare Other | Attending: Emergency Medicine | Admitting: Emergency Medicine

## 2011-01-04 DIAGNOSIS — R05 Cough: Secondary | ICD-10-CM | POA: Insufficient documentation

## 2011-01-04 DIAGNOSIS — I10 Essential (primary) hypertension: Secondary | ICD-10-CM | POA: Insufficient documentation

## 2011-01-04 DIAGNOSIS — M109 Gout, unspecified: Secondary | ICD-10-CM | POA: Insufficient documentation

## 2011-01-04 DIAGNOSIS — E785 Hyperlipidemia, unspecified: Secondary | ICD-10-CM | POA: Insufficient documentation

## 2011-01-04 DIAGNOSIS — E1149 Type 2 diabetes mellitus with other diabetic neurological complication: Secondary | ICD-10-CM | POA: Insufficient documentation

## 2011-01-04 DIAGNOSIS — F068 Other specified mental disorders due to known physiological condition: Secondary | ICD-10-CM | POA: Insufficient documentation

## 2011-01-04 DIAGNOSIS — R0602 Shortness of breath: Secondary | ICD-10-CM | POA: Insufficient documentation

## 2011-01-04 DIAGNOSIS — E1142 Type 2 diabetes mellitus with diabetic polyneuropathy: Secondary | ICD-10-CM | POA: Insufficient documentation

## 2011-01-04 DIAGNOSIS — Z794 Long term (current) use of insulin: Secondary | ICD-10-CM | POA: Insufficient documentation

## 2011-01-04 DIAGNOSIS — E039 Hypothyroidism, unspecified: Secondary | ICD-10-CM | POA: Insufficient documentation

## 2011-01-04 DIAGNOSIS — R059 Cough, unspecified: Secondary | ICD-10-CM | POA: Insufficient documentation

## 2011-01-04 LAB — DIFFERENTIAL
Eosinophils Relative: 1 % (ref 0–5)
Lymphocytes Relative: 15 % (ref 12–46)
Lymphs Abs: 1.6 10*3/uL (ref 0.7–4.0)
Monocytes Absolute: 1.6 10*3/uL — ABNORMAL HIGH (ref 0.1–1.0)

## 2011-01-04 LAB — BASIC METABOLIC PANEL
BUN: 30 mg/dL — ABNORMAL HIGH (ref 6–23)
CO2: 27 mEq/L (ref 19–32)
GFR calc non Af Amer: 36 mL/min — ABNORMAL LOW (ref >60)
Glucose, Bld: 187 mg/dL — ABNORMAL HIGH (ref 70–99)
Potassium: 4.1 mEq/L (ref 3.5–5.1)

## 2011-01-04 LAB — CBC
HCT: 30.9 % — ABNORMAL LOW (ref 36.0–46.0)
MCV: 91.2 fL (ref 78.0–100.0)
RDW: 15.2 % (ref 11.5–15.5)
WBC: 10.7 10*3/uL — ABNORMAL HIGH (ref 4.0–10.5)

## 2011-01-04 LAB — GLUCOSE, CAPILLARY: Glucose-Capillary: 256 mg/dL — ABNORMAL HIGH (ref 70–99)

## 2011-01-21 ENCOUNTER — Ambulatory Visit (HOSPITAL_COMMUNITY): Payer: Medicare Other

## 2011-01-24 ENCOUNTER — Ambulatory Visit (HOSPITAL_COMMUNITY)
Admission: RE | Admit: 2011-01-24 | Discharge: 2011-01-24 | Disposition: A | Discharge status: Home / Self Care | Payer: Medicare Other | Source: Ambulatory Visit | Attending: Internal Medicine | Admitting: Internal Medicine

## 2011-01-24 DIAGNOSIS — I10 Essential (primary) hypertension: Secondary | ICD-10-CM | POA: Insufficient documentation

## 2011-01-24 DIAGNOSIS — I509 Heart failure, unspecified: Secondary | ICD-10-CM | POA: Insufficient documentation

## 2011-01-24 DIAGNOSIS — I059 Rheumatic mitral valve disease, unspecified: Secondary | ICD-10-CM

## 2011-01-24 DIAGNOSIS — R0609 Other forms of dyspnea: Secondary | ICD-10-CM | POA: Insufficient documentation

## 2011-01-24 DIAGNOSIS — R0989 Other specified symptoms and signs involving the circulatory and respiratory systems: Secondary | ICD-10-CM | POA: Insufficient documentation

## 2011-04-28 ENCOUNTER — Inpatient Hospital Stay (HOSPITAL_COMMUNITY)
Admission: EM | Admit: 2011-04-28 | Discharge: 2011-04-29 | DRG: 313 | Disposition: A | Discharge status: Home Health | Payer: Medicare Other | Attending: Cardiovascular Disease | Admitting: Cardiovascular Disease

## 2011-04-28 ENCOUNTER — Emergency Department (HOSPITAL_COMMUNITY): Payer: Medicare Other

## 2011-04-28 DIAGNOSIS — F329 Major depressive disorder, single episode, unspecified: Secondary | ICD-10-CM | POA: Diagnosis present

## 2011-04-28 DIAGNOSIS — M359 Systemic involvement of connective tissue, unspecified: Secondary | ICD-10-CM | POA: Diagnosis present

## 2011-04-28 DIAGNOSIS — E119 Type 2 diabetes mellitus without complications: Secondary | ICD-10-CM | POA: Diagnosis present

## 2011-04-28 DIAGNOSIS — Z9849 Cataract extraction status, unspecified eye: Secondary | ICD-10-CM

## 2011-04-28 DIAGNOSIS — IMO0001 Reserved for inherently not codable concepts without codable children: Secondary | ICD-10-CM | POA: Diagnosis present

## 2011-04-28 DIAGNOSIS — I10 Essential (primary) hypertension: Secondary | ICD-10-CM | POA: Diagnosis present

## 2011-04-28 DIAGNOSIS — Z8673 Personal history of transient ischemic attack (TIA), and cerebral infarction without residual deficits: Secondary | ICD-10-CM

## 2011-04-28 DIAGNOSIS — F0390 Unspecified dementia without behavioral disturbance: Secondary | ICD-10-CM | POA: Diagnosis present

## 2011-04-28 DIAGNOSIS — R1310 Dysphagia, unspecified: Secondary | ICD-10-CM | POA: Diagnosis present

## 2011-04-28 DIAGNOSIS — M109 Gout, unspecified: Secondary | ICD-10-CM | POA: Diagnosis present

## 2011-04-28 DIAGNOSIS — F411 Generalized anxiety disorder: Secondary | ICD-10-CM | POA: Diagnosis present

## 2011-04-28 DIAGNOSIS — D649 Anemia, unspecified: Secondary | ICD-10-CM | POA: Diagnosis present

## 2011-04-28 DIAGNOSIS — K589 Irritable bowel syndrome without diarrhea: Secondary | ICD-10-CM | POA: Diagnosis present

## 2011-04-28 DIAGNOSIS — Z860100 Personal history of colon polyps, unspecified: Secondary | ICD-10-CM

## 2011-04-28 DIAGNOSIS — E785 Hyperlipidemia, unspecified: Secondary | ICD-10-CM | POA: Diagnosis present

## 2011-04-28 DIAGNOSIS — K219 Gastro-esophageal reflux disease without esophagitis: Secondary | ICD-10-CM | POA: Diagnosis present

## 2011-04-28 DIAGNOSIS — R0789 Other chest pain: Principal | ICD-10-CM | POA: Diagnosis present

## 2011-04-28 DIAGNOSIS — E039 Hypothyroidism, unspecified: Secondary | ICD-10-CM | POA: Diagnosis present

## 2011-04-28 DIAGNOSIS — H3530 Unspecified macular degeneration: Secondary | ICD-10-CM | POA: Diagnosis present

## 2011-04-28 DIAGNOSIS — Z79899 Other long term (current) drug therapy: Secondary | ICD-10-CM

## 2011-04-28 DIAGNOSIS — IMO0002 Reserved for concepts with insufficient information to code with codable children: Secondary | ICD-10-CM

## 2011-04-28 DIAGNOSIS — I517 Cardiomegaly: Secondary | ICD-10-CM | POA: Diagnosis present

## 2011-04-28 DIAGNOSIS — R079 Chest pain, unspecified: Secondary | ICD-10-CM

## 2011-04-28 DIAGNOSIS — M810 Age-related osteoporosis without current pathological fracture: Secondary | ICD-10-CM | POA: Diagnosis present

## 2011-04-28 DIAGNOSIS — F3289 Other specified depressive episodes: Secondary | ICD-10-CM | POA: Diagnosis present

## 2011-04-28 DIAGNOSIS — R404 Transient alteration of awareness: Secondary | ICD-10-CM | POA: Diagnosis present

## 2011-04-28 DIAGNOSIS — J9819 Other pulmonary collapse: Secondary | ICD-10-CM | POA: Diagnosis present

## 2011-04-28 DIAGNOSIS — Z7982 Long term (current) use of aspirin: Secondary | ICD-10-CM

## 2011-04-28 LAB — POCT I-STAT TROPONIN I: Troponin i, poc: 0.01 ng/mL (ref 0.00–0.08)

## 2011-04-28 LAB — CBC
HCT: 34.3 % — ABNORMAL LOW (ref 36.0–46.0)
Hemoglobin: 10.9 g/dL — ABNORMAL LOW (ref 12.0–15.0)
MCH: 28.6 pg (ref 26.0–34.0)
MCHC: 31.8 g/dL (ref 30.0–36.0)
MCV: 90 fL (ref 78.0–100.0)

## 2011-04-28 LAB — COMPREHENSIVE METABOLIC PANEL
BUN: 26 mg/dL — ABNORMAL HIGH (ref 6–23)
Calcium: 9.1 mg/dL (ref 8.4–10.5)
Creatinine, Ser: 1.2 mg/dL — ABNORMAL HIGH (ref 0.50–1.10)
GFR calc Af Amer: 53 mL/min — ABNORMAL LOW (ref >60)
GFR calc non Af Amer: 43 mL/min — ABNORMAL LOW (ref >60)
Glucose, Bld: 142 mg/dL — ABNORMAL HIGH (ref 70–99)
Sodium: 134 mEq/L — ABNORMAL LOW (ref 135–145)
Total Protein: 8.2 g/dL (ref 6.0–8.3)

## 2011-04-29 DIAGNOSIS — R079 Chest pain, unspecified: Secondary | ICD-10-CM

## 2011-04-29 LAB — BASIC METABOLIC PANEL
CO2: 28 mEq/L (ref 19–32)
Calcium: 8.9 mg/dL (ref 8.4–10.5)
Creatinine, Ser: 1.01 mg/dL (ref 0.50–1.10)
GFR calc non Af Amer: 53 mL/min — ABNORMAL LOW (ref >60)
Sodium: 138 mEq/L (ref 135–145)

## 2011-04-29 LAB — CARDIAC PANEL(CRET KIN+CKTOT+MB+TROPI)
CK, MB: 3.8 ng/mL (ref 0.3–4.0)
Total CK: 53 U/L (ref 7–177)

## 2011-04-29 LAB — CBC
MCH: 29 pg (ref 26.0–34.0)
MCHC: 32.1 g/dL (ref 30.0–36.0)
MCV: 90.2 fL (ref 78.0–100.0)
Platelets: 198 10*3/uL (ref 150–400)

## 2011-04-29 LAB — GLUCOSE, CAPILLARY
Glucose-Capillary: 104 mg/dL — ABNORMAL HIGH (ref 70–99)
Glucose-Capillary: 101 mg/dL — ABNORMAL HIGH (ref 70–99)

## 2011-04-29 LAB — HEMOGLOBIN A1C
Hgb A1c MFr Bld: 6.5 % — ABNORMAL HIGH (ref <5.7)
Mean Plasma Glucose: 140 mg/dL — ABNORMAL HIGH (ref <117)

## 2011-04-29 LAB — CK TOTAL AND CKMB (NOT AT ARMC)
CK, MB: 2.7 ng/mL (ref 0.3–4.0)
Relative Index: INVALID (ref 0.0–2.5)
Total CK: 47 U/L (ref 7–177)

## 2011-04-29 LAB — TSH: TSH: 1.21 u[IU]/mL (ref 0.350–4.500)

## 2011-04-29 LAB — MRSA PCR SCREENING: MRSA by PCR: POSITIVE — AB

## 2011-04-29 LAB — TROPONIN I: Troponin I: <0.3 ng/mL (ref <0.30)

## 2011-05-12 LAB — URINALYSIS, ROUTINE W REFLEX MICROSCOPIC
Bilirubin Urine: NEGATIVE
Glucose, UA: NEGATIVE
Hgb urine dipstick: NEGATIVE
Ketones, ur: NEGATIVE
Protein, ur: NEGATIVE
pH: 7

## 2011-05-12 LAB — COMPREHENSIVE METABOLIC PANEL
ALT: 10
AST: 19
Albumin: 3.7
Alkaline Phosphatase: 45
Calcium: 9.5
GFR calc Af Amer: >60
Glucose, Bld: 158 — ABNORMAL HIGH
Potassium: 3.6
Sodium: 135
Total Protein: 7.1

## 2011-05-12 LAB — DIFFERENTIAL
Basophils Relative: 1
Eosinophils Absolute: 0.1
Eosinophils Relative: 1
Lymphs Abs: 1.2
Monocytes Absolute: 0.6
Monocytes Relative: 8

## 2011-05-12 LAB — CBC
Hemoglobin: 11.5 — ABNORMAL LOW
MCHC: 34
RBC: 3.68 — ABNORMAL LOW
RDW: 15.8 — ABNORMAL HIGH

## 2011-05-12 LAB — AMYLASE: Amylase: 68

## 2011-05-13 LAB — GLUCOSE, CAPILLARY
Glucose-Capillary: 152 — ABNORMAL HIGH
Glucose-Capillary: 152 — ABNORMAL HIGH
Glucose-Capillary: 183 — ABNORMAL HIGH

## 2011-05-13 LAB — COMPREHENSIVE METABOLIC PANEL
ALT: 16
ALT: 20
AST: 24
Albumin: 3 — ABNORMAL LOW
Alkaline Phosphatase: 39
Alkaline Phosphatase: 54
BUN: 9
CO2: 27
Calcium: 10.1
Calcium: 8.9
GFR calc Af Amer: >60
GFR calc non Af Amer: 60 — ABNORMAL LOW
Glucose, Bld: 137 — ABNORMAL HIGH
Glucose, Bld: 169 — ABNORMAL HIGH
Potassium: 3.6
Sodium: 131 — ABNORMAL LOW
Sodium: 132 — ABNORMAL LOW
Total Protein: 6.5

## 2011-05-13 LAB — DIFFERENTIAL
Basophils Relative: 0
Eosinophils Absolute: 0.1
Lymphs Abs: 1.2
Neutro Abs: 8.2 — ABNORMAL HIGH
Neutrophils Relative %: 83 — ABNORMAL HIGH

## 2011-05-13 LAB — CBC
HCT: 33.7 — ABNORMAL LOW
Hemoglobin: 10 — ABNORMAL LOW
Hemoglobin: 11.6 — ABNORMAL LOW
MCHC: 34.2
MCHC: 34.3
RBC: 3.58 — ABNORMAL LOW
RDW: 14.8

## 2011-05-14 LAB — CBC
HCT: 35.9 — ABNORMAL LOW
Hemoglobin: 12.2
MCV: 93.3
Platelets: 243
RBC: 3.85 — ABNORMAL LOW
WBC: 14.2 — ABNORMAL HIGH

## 2011-05-14 LAB — DIFFERENTIAL
Eosinophils Absolute: 0.1
Eosinophils Relative: 0
Lymphocytes Relative: 8 — ABNORMAL LOW
Lymphs Abs: 1.1
Monocytes Absolute: 0.9
Monocytes Relative: 6

## 2011-05-14 LAB — POCT I-STAT, CHEM 8
BUN: 28 — ABNORMAL HIGH
Creatinine, Ser: 1.8 — ABNORMAL HIGH
Glucose, Bld: 152 — ABNORMAL HIGH
Sodium: 129 — ABNORMAL LOW
TCO2: 26

## 2011-05-15 LAB — DIFFERENTIAL
Basophils Absolute: 0.1 10*3/uL (ref 0.0–0.1)
Basophils Relative: 1 % (ref 0–1)
Eosinophils Relative: 5 % (ref 0–5)
Monocytes Absolute: 0.8 10*3/uL (ref 0.1–1.0)

## 2011-05-15 LAB — CBC
HCT: 33.7 % — ABNORMAL LOW (ref 36.0–46.0)
Hemoglobin: 11.1 g/dL — ABNORMAL LOW (ref 12.0–15.0)
MCHC: 32.9 g/dL (ref 30.0–36.0)
Platelets: 227 10*3/uL (ref 150–400)
RDW: 14.8 % (ref 11.5–15.5)

## 2011-05-15 LAB — BASIC METABOLIC PANEL
BUN: 17 mg/dL (ref 6–23)
CO2: 28 mEq/L (ref 19–32)
Calcium: 9.5 mg/dL (ref 8.4–10.5)
Glucose, Bld: 128 mg/dL — ABNORMAL HIGH (ref 70–99)
Sodium: 136 mEq/L (ref 135–145)

## 2011-05-15 LAB — URINALYSIS, ROUTINE W REFLEX MICROSCOPIC
Leukocytes, UA: NEGATIVE
Nitrite: NEGATIVE
Specific Gravity, Urine: 1.008 (ref 1.005–1.030)
pH: 6 (ref 5.0–8.0)

## 2011-05-15 LAB — POCT CARDIAC MARKERS: Troponin i, poc: <0.05 ng/mL (ref 0.00–0.09)

## 2011-05-15 LAB — URINE MICROSCOPIC-ADD ON: Urine-Other: NONE SEEN

## 2011-05-21 ENCOUNTER — Encounter: Payer: Self-pay | Admitting: Cardiology

## 2011-05-21 ENCOUNTER — Encounter: Payer: Medicare Other | Admitting: Cardiology

## 2011-05-22 NOTE — H&P (Signed)
[name], [name] NO.:  [id number]  MEDICAL RECORD NO.:  [id number]  LOCATION:  [facility]                         FACILITY:  [facility]  PHYSICIAN:  Rollene Rotunda, MD, FACCDATE OF BIRTH:  11/04/1930  DATE OF ADMISSION:  04/28/2011 DATE OF DISCHARGE:                             HISTORY & PHYSICAL   PRIMARY:  Maxwell Caul, MD  CARDIOLOGIST:  Was Dr. Roger Shelter.  CHIEF COMPLAINT:  Chest pain.  HISTORY OF PRESENT ILLNESS:  The patient is 77 year old.  She has a vague cardiac history.  I see previous echocardiograms and she says she was seen by Dr. Deborah Chalk over the years routinely, but she has no history of coronary artery disease or heart attack.  She may have had a stress test in Dr. Ronnald Nian office in the last few years.  I do see an echocardiogram done a couple of days ago at [facility] apparently to evaluate lower extremity edema.  She had some LVH, but no significant left ventricular abnormalities with no wall motion abnormalities and a normal systolic ejection fraction.  She is describing chest discomfort.  She apparently has some dementia, though she seems fairly clear when I am talking to her.  She described as 7/10 discomfort.  It comes off and on.  She cannot bring it on with activities.  She says she does walk with a walker and she cannot bring this on.  She had never had this pain before.  It has been happening probably for several days.  It is a stinging sensation.  She might have some left arm discomfort.  She might be a little more short of breath, but she is not describing any PND or orthopnea.  She is not having any palpitations, presyncope, or syncope.  Of note, she did have some nausea and apparent vomiting this morning when she tried to take some potassium.  She also describes fairly reproducible dysphagia.  PAST MEDICAL HISTORY: 1. Delirium. 2. Dementia. 3. Previous failure thrive. 4. Type 2 diabetes mellitus. 5.  Hypothyroidism. 6. Hyperlipidemia. 7. Fibromyalgia. 8. Hypertension. 9. TIA. 10.Chronic anemia. 11.Irritable bowel syndrome. 12.Osteoporosis. 13.Gout. 14.Anxiety/depression. 15.Connective tissue disease with chronic steroid therapy. 16.Colonic polyps. 17.Apparent previous pericarditis. 18.Pancreatitis. 19.Macular degeneration. 20.Gastroesophageal reflux disease. 21.Cataracts.  PAST SURGICAL HISTORY:  Bladder tack, cataract extraction, polypectomy, hysterectomy, and cholecystectomy.  ALLERGIES: 1. RAMIPRIL. 2. ACTOS. 3. COZAAR. 4. FOSAMAX. 5. DIOVAN. 6. NEURONTIN. 7. ZETIA. 8. LANTUS.  SOCIAL HISTORY:  The patient lives at [facility].  She did have children living in her house with her prior to this.  FAMILY HISTORY:  Contributory for diabetes and CVAs, but not apparently for early coronary artery disease.  REVIEW OF SYSTEMS:  As stated in the HPI, positive for chronic constipation.  Positive for cold intolerance.  Negative for all other systems.  PHYSICAL EXAMINATION:  GENERAL:  The patient is in no distress.  She is pleasant. VITAL SIGNS:  Blood pressure 143/81, heart rate 75 and regular, afebrile, and respiratory rate 18. HEENT:  Eyes are unremarkable.  Pupils are equal, round, and reactive to light.  Fundi not visualized.  Oral mucosa unremarkable. NECK:  No jugular venous distention at 45 degrees.  Carotid upstroke brisk and symmetrical.  No bruits, no thyromegaly. LYMPHATICS:  No cervical, axillary, or inguinal adenopathy. LUNGS:  Clear to auscultation bilaterally. BACK:  No costovertebral angle tenderness. CHEST:  Unremarkable. HEART:  PMI not displaced or sustained.  S1 and S2 within normal limits. No S3, no S4, no clicks, no rubs, no murmurs. ABDOMEN:  Obese, positive bowel sounds normal frequency and pitch.  No bruits, no rebound, no guarding, no midline pulsatile mass, no hepatomegaly, no splenomegaly. SKIN:  No rashes, no nodules. EXTREMITIES:   2+ pulses throughout.  No edema, no cyanosis, no clubbing. NEURO:  Oriented to person and place and situation.  Cranial nerves grossly intact, motor grossly intact.  Sodium 134, potassium 5.0, BUN 26, and creatinine 1.2.  WBC 9.5, hemoglobin 10.9, and platelets 237.8.  Point-of-care marker 0.01.  Chest x-ray:  Cardiomegaly with bilateral basilar atelectasis.  EKG:  Right bundle-branch block, no acute EKG changes, no changes from Dec 27, 2010.  ASSESSMENT/PLAN: 1. Chest:  The patient's chest comfort is somewhat atypical.  I do not     see a past history of coronary artery disease.  There might be a     stress test recently in the [facility] office, but I do     not have one available records.  I do see the echocardiogram is     noted which was apparently done for lower extremity edema.  The     patient would not want the cardiac catheterization.  She might     consent to stress testing.  At this point, we will admit.  I will     treat her with heparin, aspirin, and nitroglycerin sublingual as     needed.  I will cycle cardiac enzymes and repeat an EKG in the     morning.  We can check the office medical records and consider     inpatient stress testing.  I would also consider GI evaluation once     a cardiac workup is completed given her difficulty swallowing. 2. Diabetes.  Continue previous medications and cover with sliding     scale insulin.  She reports a.m. hypoglycemia, so I will not give     her nighttime sliding scale. 3. Do not resuscitate.  The patient tells me she was to be a full     code.  She seems quite reasonable, though I see her previous     history of dementia.  Tonight, I will have her be a full code     though she carries with her a do not resuscitate order.  This will     need to be clarified with the patient's primary physician and     family. 4. Hypothyroidism.  I will check TSH and continue her medications as     listed.     Rollene Rotunda,  MD, [facility]     JH/MEDQ  D:  04/29/2011  T:  04/29/2011  Job:  [id number]  Electronically Signed by Rollene Rotunda MD [facility] on 05/22/2011 01:37:07 PM

## 2011-05-23 LAB — COMPREHENSIVE METABOLIC PANEL
ALT: 13
ALT: 15
ALT: 9
AST: 19
AST: 20
AST: 12
Albumin: 3.9
Alkaline Phosphatase: 48
Alkaline Phosphatase: 56
BUN: 18
BUN: 30 — ABNORMAL HIGH
BUN: 9
CO2: 22
CO2: 26
CO2: 24
CO2: 26
Calcium: 9.7
Calcium: 9.7
Calcium: 10
Chloride: 100
Chloride: 101
Chloride: 108
Creatinine, Ser: 0.94
Creatinine, Ser: 1.08
Creatinine, Ser: 1.6 — ABNORMAL HIGH
Creatinine, Ser: 0.82
GFR calc Af Amer: 38 — ABNORMAL LOW
GFR calc Af Amer: >60
GFR calc Af Amer: >60
GFR calc Af Amer: >60
GFR calc non Af Amer: 32 — ABNORMAL LOW
GFR calc non Af Amer: 50 — ABNORMAL LOW
GFR calc non Af Amer: 50 — ABNORMAL LOW
GFR calc non Af Amer: >60
Glucose, Bld: 122 — ABNORMAL HIGH
Glucose, Bld: 143 — ABNORMAL HIGH
Glucose, Bld: 128 — ABNORMAL HIGH
Potassium: 4.7
Sodium: 135
Sodium: 128 — ABNORMAL LOW
Sodium: 139
Total Bilirubin: 0.7
Total Bilirubin: 0.8
Total Bilirubin: 0.6
Total Protein: 7.7
Total Protein: 8.6 — ABNORMAL HIGH

## 2011-05-23 LAB — CBC
HCT: 39.2
HCT: 40.4
Hemoglobin: 13.3
Hemoglobin: 11.1 — ABNORMAL LOW
Hemoglobin: 13.7
MCHC: 33.8
MCHC: 33.9
MCHC: 34.2
MCHC: 33.9
MCHC: 34.2
MCV: 91.7
MCV: 92.8
MCV: 93.7
RBC: 4.22
RBC: 3.48 — ABNORMAL LOW
RBC: 3.69 — ABNORMAL LOW
RBC: 4.37
RDW: 16.5 — ABNORMAL HIGH
RDW: 16.9 — ABNORMAL HIGH
RDW: 17.9 — ABNORMAL HIGH
WBC: 11.6 — ABNORMAL HIGH
WBC: 8.1
WBC: 8.5

## 2011-05-23 LAB — I-STAT 8, (EC8 V) (CONVERTED LAB)
BUN: 20
Bicarbonate: 24.9 — ABNORMAL HIGH
Chloride: 102
Glucose, Bld: 168 — ABNORMAL HIGH
Hemoglobin: 15
Potassium: 4.8
Sodium: 133 — ABNORMAL LOW

## 2011-05-23 LAB — URINALYSIS, ROUTINE W REFLEX MICROSCOPIC
Leukocytes, UA: NEGATIVE
Nitrite: NEGATIVE
Specific Gravity, Urine: 1.015
Urobilinogen, UA: 0.2

## 2011-05-23 LAB — BASIC METABOLIC PANEL
CO2: 25
Calcium: 9.1
Calcium: 9.3
Creatinine, Ser: 0.99
GFR calc Af Amer: >60
GFR calc Af Amer: >60
GFR calc non Af Amer: 55 — ABNORMAL LOW
Potassium: 3.4 — ABNORMAL LOW
Sodium: 130 — ABNORMAL LOW

## 2011-05-23 LAB — URINALYSIS, MICROSCOPIC ONLY
Glucose, UA: NEGATIVE
Hgb urine dipstick: NEGATIVE
Specific Gravity, Urine: 1.017

## 2011-05-23 LAB — CULTURE, BLOOD (ROUTINE X 2): Culture: NO GROWTH

## 2011-05-23 LAB — CARDIAC PANEL(CRET KIN+CKTOT+MB+TROPI)
CK, MB: 1
Relative Index: INVALID
Total CK: 22
Total CK: 28
Troponin I: 0.02

## 2011-05-23 LAB — LIPID PANEL
Cholesterol: 198
LDL Cholesterol: 122 — ABNORMAL HIGH
Triglycerides: 227 — ABNORMAL HIGH
VLDL: 45 — ABNORMAL HIGH

## 2011-05-23 LAB — URINE MICROSCOPIC-ADD ON

## 2011-05-23 LAB — URIC ACID: Uric Acid, Serum: 10.5 — ABNORMAL HIGH

## 2011-05-23 LAB — DIFFERENTIAL
Eosinophils Absolute: 0.3
Eosinophils Relative: 2
Lymphs Abs: 1.6

## 2011-05-23 LAB — SEDIMENTATION RATE: Sed Rate: 22

## 2011-05-23 LAB — URINE CULTURE

## 2011-05-23 LAB — DIGOXIN LEVEL
Digoxin Level: 1.3
Digoxin Level: 0.8

## 2011-05-23 LAB — T4, FREE: Free T4: 2.08 — ABNORMAL HIGH

## 2011-05-23 LAB — VITAMIN D 25 HYDROXY (VIT D DEFICIENCY, FRACTURES): Vit D, 25-Hydroxy: 62 — ABNORMAL HIGH (ref 20–57)

## 2011-05-23 LAB — VALPROIC ACID LEVEL: Valproic Acid Lvl: <10 — ABNORMAL LOW

## 2011-05-26 LAB — BASIC METABOLIC PANEL
BUN: 31 — ABNORMAL HIGH
BUN: 36 — ABNORMAL HIGH
CO2: 22
CO2: 23
Calcium: 9.1
Chloride: 108
Creatinine, Ser: 1.48 — ABNORMAL HIGH
Creatinine, Ser: 1.51 — ABNORMAL HIGH
GFR calc Af Amer: 41 — ABNORMAL LOW
GFR calc Af Amer: 42 — ABNORMAL LOW
Glucose, Bld: 161 — ABNORMAL HIGH

## 2011-05-26 LAB — CBC
HCT: 26.8 — ABNORMAL LOW
HCT: 28.6 — ABNORMAL LOW
Hemoglobin: 10.3 — ABNORMAL LOW
Hemoglobin: 9.7 — ABNORMAL LOW
MCHC: 33.2
MCHC: 33.3
MCHC: 33.5
MCHC: 33.8
MCV: 91.2
MCV: 91.7
Platelets: 437 — ABNORMAL HIGH
RBC: 2.92 — ABNORMAL LOW
RBC: 3.09 — ABNORMAL LOW
RBC: 3.14 — ABNORMAL LOW
RBC: 3.36 — ABNORMAL LOW
RDW: 15.6 — ABNORMAL HIGH
RDW: 15.7 — ABNORMAL HIGH
RDW: 16 — ABNORMAL HIGH
WBC: 14.9 — ABNORMAL HIGH

## 2011-05-26 LAB — COMPREHENSIVE METABOLIC PANEL
ALT: 9
AST: 13
AST: 17
Albumin: 2.3 — ABNORMAL LOW
Alkaline Phosphatase: 45
BUN: 19
CO2: 22
CO2: 22
Calcium: 9.2
Chloride: 108
Chloride: 108
Creatinine, Ser: 1.31 — ABNORMAL HIGH
Creatinine, Ser: 1.84 — ABNORMAL HIGH
GFR calc Af Amer: 33 — ABNORMAL LOW
GFR calc Af Amer: 51 — ABNORMAL LOW
GFR calc non Af Amer: 40 — ABNORMAL LOW
GFR calc non Af Amer: 42 — ABNORMAL LOW
Potassium: 3.9
Potassium: 4.2
Sodium: 138
Total Bilirubin: 0.4
Total Bilirubin: 0.4

## 2011-05-26 LAB — CULTURE, BLOOD (ROUTINE X 2)

## 2011-05-26 LAB — CK: Total CK: 16

## 2011-05-26 LAB — GIARDIA/CRYPTOSPORIDIUM SCREEN(EIA)
Cryptosporidium Screen (EIA): NEGATIVE
Giardia Screen - EIA: NEGATIVE

## 2011-05-26 LAB — CROSSMATCH
ABO/RH(D): O POS
Antibody Screen: NEGATIVE

## 2011-05-26 LAB — FECAL FAT, QUALITATIVE: Neutral Fat: NORMAL

## 2011-06-01 NOTE — Discharge Summary (Signed)
NAMEBRYNLEY, [name]                ACCOUNT NO.:  [id number]  MEDICAL RECORD NO.:  [id number]  LOCATION:  3731                         FACILITY:  [facility]  PHYSICIAN:  Veverly Fells. Excell Seltzer, MD  DATE OF BIRTH:  [birth year]  DATE OF ADMISSION:  04/28/2011 DATE OF DISCHARGE:  04/29/2011                              DISCHARGE SUMMARY   PROCEDURE:  Portable chest x-ray.  PRIMARY FINAL DISCHARGE DIAGNOSIS:  Chest pain, cardiac enzymes negative for myocardial infarction and medical therapy recommended.  SECONDARY DIAGNOSES: 1. Anemia with a hemoglobin of 9.5, hematocrit 29.6, MCV 90.2 at     discharge. 2. History of preserved left ventricular function with an ejection     fraction of 55-60%, grade 1 diastolic dysfunction by echocardiogram     in June 2012. 3. Delirium/dementia. 4. History of failure to thrive. 5. Diabetes. 6. Hypothyroidism. 7. Hyperlipidemia. 8. Hypertension. 9. Fibromyalgia. 10.History of transient ischemic attack. 11.Irritable bowel syndrome. 12.Osteoporosis. 13.Gout. 14.Anxiety/depression. 15.Connective tissue disease/chronic steroids. 16.History of colon polyps. 17.Apparent previous pericarditis. 18.History of pancreatitis. 19.Gastroesophageal reflux disease. 20.Status post Bladder tack, cataract surgery, polypectomy,     hysterectomy, cholecystectomy. 21.Allergy or intolerance to RAMIPRIL, ACTOS, COZAAR, FOSAMAX, DIOVAN,     NEURONTIN, ZETIA, and LANTUS.  TIME AT DISCHARGE:  34 minutes.  HOSPITAL COURSE:  [name] is a 77 year old female, who had chest pain and came to the hospital where she was admitted for further evaluation.  She was anemic, but her hemoglobin was essentially unchanged from May 2012.  This will be followed.  Her cardiac enzymes were cycled and she had one troponin that was mildly elevated but all other troponins and CK- MBs were negative.  Her symptoms resolved.  A chest x-ray shows cardiomegaly with mild bibasilar  atelectasis/scarring.  On April 29, 2011, [name] was seen by Dr. Antoine Poche.  He recommended adding Imdur to her medication regimen and possible outpatient GI evaluation.  [name] is considered stable for discharge, to follow up as an outpatient.  DISCHARGE INSTRUCTIONS:  Her activity level is to be increased gradually.  She is encouraged to stick to a low-sodium diabetic diet. She is encouraged to follow up with Dr. Antoine Poche and appointment will be arranged.  She should follow up with Dr. Leanord Hawking and he should determine if GI evaluation is needed.  DISCHARGE MEDICATION: 1. Prednisone 5 mg daily. 2. Tylenol 500 mg q.a.m. and nightly as well as 2 tablets t.i.d.     p.r.n. 3. Gabapentin 400 mg t.i.d. 4. Celexa 10 mg a day. 5. Allopurinol 100 mg a day. 6. Lidocaine patch q.12 h. 7. MiraLax 17 g daily p.r.n. 8. Namenda 10 mg nightly. 9. Amlodipine 5 mg q.a.m. 10.Artificial tears t.i.d. p.r.n. 11.NovoLog 5 units b.i.d. 12.Cepacol p.r.n. 13.Lasix 20 mg daily. 14.Ocuvite daily. 15.Tramadol 50 mg q.4 h. p.r.n. 16.Aspirin 81 mg a day. 17.Protonix 40 mg a day. 18.Klor-Con 20 mEq is on hold. 19.DuoNeb q.6 h. p.r.n. 20.Amaryl 2 mg daily. 21.Synthroid 112 mcg daily. 22.Acidophilus b.i.d. 23.Caltrate plus D b.i.d. 24.Systane eye drops b.i.d. 25.Imdur 30 mg a day.     Theodore Demark, PA-C   ______________________________ Veverly Fells. Excell Seltzer, MD   RB/MEDQ  D:  04/29/2011  T:  04/29/2011  Job:  [id number]  cc:   Maxwell Caul, M.D.  Electronically Signed by Theodore Demark PA-C on 05/19/2011 06:44:58 AM Electronically Signed by Tonny Bollman MD on 06/01/2011 01:14:01 AM

## 2011-07-10 ENCOUNTER — Encounter (INDEPENDENT_AMBULATORY_CARE_PROVIDER_SITE_OTHER): Payer: Medicare Other | Admitting: Ophthalmology

## 2011-07-10 DIAGNOSIS — H33109 Unspecified retinoschisis, unspecified eye: Secondary | ICD-10-CM

## 2011-07-10 DIAGNOSIS — H43819 Vitreous degeneration, unspecified eye: Secondary | ICD-10-CM

## 2011-07-10 DIAGNOSIS — E11319 Type 2 diabetes mellitus with unspecified diabetic retinopathy without macular edema: Secondary | ICD-10-CM

## 2011-07-10 DIAGNOSIS — E1139 Type 2 diabetes mellitus with other diabetic ophthalmic complication: Secondary | ICD-10-CM

## 2011-08-23 ENCOUNTER — Encounter (HOSPITAL_COMMUNITY): Payer: Self-pay | Admitting: *Deleted

## 2011-08-23 ENCOUNTER — Emergency Department (HOSPITAL_COMMUNITY)
Admission: EM | Admit: 2011-08-23 | Discharge: 2011-08-23 | Disposition: A | Discharge status: Home / Self Care | Payer: Medicare Other | Attending: Emergency Medicine | Admitting: Emergency Medicine

## 2011-08-23 DIAGNOSIS — Z794 Long term (current) use of insulin: Secondary | ICD-10-CM | POA: Insufficient documentation

## 2011-08-23 DIAGNOSIS — Z862 Personal history of diseases of the blood and blood-forming organs and certain disorders involving the immune mechanism: Secondary | ICD-10-CM | POA: Insufficient documentation

## 2011-08-23 DIAGNOSIS — F068 Other specified mental disorders due to known physiological condition: Secondary | ICD-10-CM | POA: Insufficient documentation

## 2011-08-23 DIAGNOSIS — R059 Cough, unspecified: Secondary | ICD-10-CM | POA: Insufficient documentation

## 2011-08-23 DIAGNOSIS — Z8639 Personal history of other endocrine, nutritional and metabolic disease: Secondary | ICD-10-CM | POA: Insufficient documentation

## 2011-08-23 DIAGNOSIS — E785 Hyperlipidemia, unspecified: Secondary | ICD-10-CM | POA: Insufficient documentation

## 2011-08-23 DIAGNOSIS — Z8673 Personal history of transient ischemic attack (TIA), and cerebral infarction without residual deficits: Secondary | ICD-10-CM | POA: Insufficient documentation

## 2011-08-23 DIAGNOSIS — R109 Unspecified abdominal pain: Secondary | ICD-10-CM | POA: Insufficient documentation

## 2011-08-23 DIAGNOSIS — I10 Essential (primary) hypertension: Secondary | ICD-10-CM | POA: Insufficient documentation

## 2011-08-23 DIAGNOSIS — K5900 Constipation, unspecified: Secondary | ICD-10-CM | POA: Insufficient documentation

## 2011-08-23 DIAGNOSIS — K589 Irritable bowel syndrome without diarrhea: Secondary | ICD-10-CM | POA: Insufficient documentation

## 2011-08-23 DIAGNOSIS — Z7982 Long term (current) use of aspirin: Secondary | ICD-10-CM | POA: Insufficient documentation

## 2011-08-23 DIAGNOSIS — R011 Cardiac murmur, unspecified: Secondary | ICD-10-CM | POA: Insufficient documentation

## 2011-08-23 DIAGNOSIS — E119 Type 2 diabetes mellitus without complications: Secondary | ICD-10-CM | POA: Insufficient documentation

## 2011-08-23 DIAGNOSIS — S301XXA Contusion of abdominal wall, initial encounter: Secondary | ICD-10-CM | POA: Insufficient documentation

## 2011-08-23 DIAGNOSIS — J40 Bronchitis, not specified as acute or chronic: Secondary | ICD-10-CM | POA: Insufficient documentation

## 2011-08-23 DIAGNOSIS — M810 Age-related osteoporosis without current pathological fracture: Secondary | ICD-10-CM | POA: Insufficient documentation

## 2011-08-23 DIAGNOSIS — X58XXXA Exposure to other specified factors, initial encounter: Secondary | ICD-10-CM | POA: Insufficient documentation

## 2011-08-23 DIAGNOSIS — R05 Cough: Secondary | ICD-10-CM | POA: Insufficient documentation

## 2011-08-23 NOTE — ED Notes (Signed)
Phoned [facility] about return to facility

## 2011-08-23 NOTE — ED Notes (Signed)
Sent to er via dr Gordy Levan for utrasound of stomach. Just dx with flu 2 weeks ago. Broke out in blisters on the 10th and now has  Bruise to lleft hip

## 2011-08-23 NOTE — ED Provider Notes (Signed)
History    Scribed for Flint Melter, MD, the patient was seen in room APA14/APA14. This chart was scribed by Katha Cabal.   CSN: [id number]  Arrival date & time 08/23/11  1906   First MD Initiated Contact with Patient 08/23/11 1948      Chief Complaint  Patient presents with  . Cough    (Consider location/radiation/quality/duration/timing/severity/associated sxs/prior treatment) HPI Level 5 Caveat applies for Dementia.  Per EMS:  Dr. Gordy Levan sent patient over for left hip bruising and cough.     Patient had CXR that showed congestive heart failure on 08/13/11.  Patient was diagnosed with influenza A on 08/15/11. Patient completed Tamiflu on 08/19/11.  Patient has been treated with nebulizers for shortness of breath.  Patient was treated with Levaquin from 1/5-1-/10/13.  Patient presents with left side bruise that was noticed today.  Patient denies recent fall.       Past Medical History  Diagnosis Date  . Diabetes mellitus   . Hypertension   . Hyperlipidemia   . Anemia   . Anxiety   . Fibromyalgia   . TIA (transient ischemic attack)   . IBS (irritable bowel syndrome)   . Dementia   . Thyroid disease   . Gout   . Osteoporosis   . Colon polyp   . History of pericarditis   . Pancreatitis     Past Surgical History  Procedure Date  . Polypectomy   . Cholecystectomy   . Bladder surgery     Family History  Problem Relation Age of Onset  . Diabetes Mother   . Diabetes Brother   . Diabetes Brother     History  Substance Use Topics  . Smoking status: Not on file  . Smokeless tobacco: Not on file  . Alcohol Use:     OB History    Grav Para Term Preterm Abortions TAB SAB Ect Mult Living                  Review of Systems  Unable to perform ROS: Dementia    Allergies  Actos  Home Medications   Current Outpatient Rx  Name Route Sig Dispense Refill  . ACETAMINOPHEN 500 MG PO TABS Oral Take 500 mg by mouth every 6 (six) hours as needed.      .  ALLOPURINOL 100 MG PO TABS Oral Take 100 mg by mouth daily.      Marland Kitchen AMLODIPINE BESYLATE 5 MG PO TABS Oral Take 5 mg by mouth daily.      . ASPIRIN 81 MG PO TABS Oral Take 81 mg by mouth daily.      . OCUVITE PO TABS Oral Take 1 tablet by mouth daily.      Marland Kitchen CALTRATE 600 PO Oral Take by mouth.      Marland Kitchen CITALOPRAM HYDROBROMIDE 10 MG PO TABS Oral Take 10 mg by mouth daily.      . FUROSEMIDE 20 MG PO TABS Oral Take 20 mg by mouth daily.      Marland Kitchen GABAPENTIN 400 MG PO CAPS Oral Take 400 mg by mouth 3 (three) times daily.      Marland Kitchen GLIMEPIRIDE 2 MG PO TABS Oral Take 2 mg by mouth daily before breakfast.      . ARTIFICIAL TEARS OP Ophthalmic Apply to eye daily.      . INSULIN ASPART 100 UNIT/ML [place] SOLN Subcutaneous Inject into the skin as directed.      . DUONEB IN Inhalation Inhale into  the lungs.      . ISOSORBIDE MONONITRATE ER 30 MG PO TB24 Oral Take 30 mg by mouth daily.      . ACIDOPHILUS PO Oral Take by mouth.      Marland Kitchen LEVOTHYROXINE SODIUM 112 MCG PO TABS Oral Take 112 mcg by mouth daily.      Marland Kitchen LIDOCAINE 5 % EX PTCH Transdermal Place 1 patch onto the skin daily. Remove & Discard patch within 12 hours or as directed by MD     . MEMANTINE HCL 10 MG PO TABS Oral Take 10 mg by mouth daily.      Marland Kitchen PANTOPRAZOLE SODIUM 40 MG PO TBEC Oral Take 40 mg by mouth daily.      Frazier Butt OP Ophthalmic Apply to eye.      Marland Kitchen POLYETHYLENE GLYCOL 3350 PO PACK Oral Take 17 g by mouth as needed.      Marland Kitchen POTASSIUM CHLORIDE CRYS ER 20 MEQ PO TBCR Oral Take 20 mEq by mouth daily.      Marland Kitchen PREDNISONE (PAK) 5 MG PO TABS Oral Take by mouth daily.      . TRAMADOL HCL 50 MG PO TABS Oral Take 50 mg by mouth every 6 (six) hours as needed.        BP 124/69  Pulse 87  Temp(Src) 98.5 F (36.9 C) (Oral)  Resp 20  Ht 5\' 4"  (1.626 m)  Wt 160 lb (72.576 kg)  BMI 27.46 kg/m2  SpO2 98%  Physical Exam  Nursing note and vitals reviewed. Constitutional: She appears well-developed and well-nourished.  HENT:  Head: Normocephalic and  atraumatic.  Mouth/Throat: Uvula is midline and oropharynx is clear and moist. Mucous membranes are not dry.  Eyes: Conjunctivae and EOM are normal. Pupils are equal, round, and reactive to light.  Neck: Normal range of motion and phonation normal. Neck supple.  Cardiovascular: Normal rate and regular rhythm.   Murmur heard.      Systolic murmur that is grade 2/6  Pulmonary/Chest: Effort normal. No respiratory distress. She has rales. She exhibits no tenderness.       Scattered rales, no wheezes, no rhonchi   Abdominal: Soft. Bowel sounds are normal. She exhibits no distension and no mass. There is no hepatomegaly. There is tenderness. There is no guarding.       Mild LUQ and LLQ tenderness  Genitourinary: Guaiac negative stool.       Soft brown stool that is heme negative  Musculoskeletal: Normal range of motion.       Left flank with slightly raised 5 by 10 cm ecchymotic area, no lower extremity tenderness   Neurological: She is alert. She has normal strength and normal reflexes. She exhibits normal muscle tone.  Skin: Skin is warm and dry.  Psychiatric: She has a normal mood and affect. Her behavior is normal.    ED Course  Procedures (including critical care time)   DIAGNOSTIC STUDIES: Oxygen Saturation is 98% nasal cannula normal by my interpretation.     COORDINATION OF CARE: 8:09 PM  Reviewed intake paperwork. 8:12 PM  Rectal exam performed. Physical exam complete.  8:17 PM  Plan to discharge patient.  Patient agrees with plan.         LABS / RADIOLOGY:   Labs Reviewed - No data to display No results found.       MDM  Nonspecific contusion to the left flank without associated injuries. Patient appears to have a post influenza bronchitis, without persistent, infection. She  may have an element of abdominal pain that is likely secondary to the recent use of both Tamiflu and Levaquin. Her vital signs are normal. Oxygen saturation is normal. The patient can be  treated symptomatically with her current medicines that she has on her MAR the skilled nursing facility.      MEDICATIONS GIVEN IN THE E.D.- none       IMPRESSION: 1. Contusion, flank   2. Bronchitis   3. Constipation        I personally performed the services described in this documentation, which was scribed in my presence. The recorded information has been reviewed and considered.  Scribe         Flint Melter, MD 08/23/11 2036

## 2011-08-23 NOTE — ED Notes (Signed)
Hemoccult done via dr Effie Shy with negative results

## 2012-01-27 ENCOUNTER — Other Ambulatory Visit (HOSPITAL_BASED_OUTPATIENT_CLINIC_OR_DEPARTMENT_OTHER): Payer: Self-pay | Admitting: Internal Medicine

## 2012-01-27 DIAGNOSIS — Z1231 Encounter for screening mammogram for malignant neoplasm of breast: Secondary | ICD-10-CM

## 2012-02-06 ENCOUNTER — Ambulatory Visit: Payer: Medicare Other

## 2012-02-11 ENCOUNTER — Ambulatory Visit: Payer: Medicare Other

## 2012-06-04 ENCOUNTER — Other Ambulatory Visit (HOSPITAL_BASED_OUTPATIENT_CLINIC_OR_DEPARTMENT_OTHER): Payer: Self-pay | Admitting: Internal Medicine

## 2012-06-04 ENCOUNTER — Ambulatory Visit (HOSPITAL_COMMUNITY)
Admission: RE | Admit: 2012-06-04 | Discharge: 2012-06-04 | Disposition: A | Discharge status: Home / Self Care | Payer: Medicare Other | Source: Ambulatory Visit | Attending: Internal Medicine | Admitting: Internal Medicine

## 2012-06-04 DIAGNOSIS — M25579 Pain in unspecified ankle and joints of unspecified foot: Secondary | ICD-10-CM | POA: Insufficient documentation

## 2012-06-04 DIAGNOSIS — R52 Pain, unspecified: Secondary | ICD-10-CM

## 2012-06-04 DIAGNOSIS — R937 Abnormal findings on diagnostic imaging of other parts of musculoskeletal system: Secondary | ICD-10-CM | POA: Insufficient documentation

## 2012-06-23 ENCOUNTER — Ambulatory Visit (INDEPENDENT_AMBULATORY_CARE_PROVIDER_SITE_OTHER): Payer: Medicare Other | Admitting: Ophthalmology

## 2012-06-23 DIAGNOSIS — H3530 Unspecified macular degeneration: Secondary | ICD-10-CM

## 2012-06-23 DIAGNOSIS — I10 Essential (primary) hypertension: Secondary | ICD-10-CM

## 2012-06-23 DIAGNOSIS — H43819 Vitreous degeneration, unspecified eye: Secondary | ICD-10-CM

## 2012-06-23 DIAGNOSIS — H33309 Unspecified retinal break, unspecified eye: Secondary | ICD-10-CM

## 2012-06-23 DIAGNOSIS — H33109 Unspecified retinoschisis, unspecified eye: Secondary | ICD-10-CM

## 2012-06-23 DIAGNOSIS — H35039 Hypertensive retinopathy, unspecified eye: Secondary | ICD-10-CM

## 2012-07-12 ENCOUNTER — Ambulatory Visit (INDEPENDENT_AMBULATORY_CARE_PROVIDER_SITE_OTHER): Payer: Medicare Other | Admitting: Ophthalmology

## 2012-09-14 ENCOUNTER — Encounter: Payer: Self-pay | Admitting: Cardiology

## 2012-11-17 DIAGNOSIS — G909 Disorder of the autonomic nervous system, unspecified: Secondary | ICD-10-CM

## 2012-11-17 DIAGNOSIS — I1310 Hypertensive heart and chronic kidney disease without heart failure, with stage 1 through stage 4 chronic kidney disease, or unspecified chronic kidney disease: Secondary | ICD-10-CM

## 2012-11-17 DIAGNOSIS — E1149 Type 2 diabetes mellitus with other diabetic neurological complication: Secondary | ICD-10-CM

## 2012-12-22 ENCOUNTER — Encounter (INDEPENDENT_AMBULATORY_CARE_PROVIDER_SITE_OTHER): Payer: PRIVATE HEALTH INSURANCE | Admitting: Ophthalmology

## 2012-12-22 DIAGNOSIS — E039 Hypothyroidism, unspecified: Secondary | ICD-10-CM

## 2012-12-22 DIAGNOSIS — F0390 Unspecified dementia without behavioral disturbance: Secondary | ICD-10-CM

## 2012-12-22 DIAGNOSIS — H353290 Exudative age-related macular degeneration, unspecified eye, stage unspecified: Secondary | ICD-10-CM

## 2013-01-12 ENCOUNTER — Encounter (INDEPENDENT_AMBULATORY_CARE_PROVIDER_SITE_OTHER): Payer: PRIVATE HEALTH INSURANCE | Admitting: Ophthalmology

## 2013-01-12 DIAGNOSIS — H3530 Unspecified macular degeneration: Secondary | ICD-10-CM

## 2013-01-12 DIAGNOSIS — H35039 Hypertensive retinopathy, unspecified eye: Secondary | ICD-10-CM

## 2013-01-12 DIAGNOSIS — I10 Essential (primary) hypertension: Secondary | ICD-10-CM

## 2013-01-12 DIAGNOSIS — H43819 Vitreous degeneration, unspecified eye: Secondary | ICD-10-CM

## 2013-01-17 DIAGNOSIS — F0391 Unspecified dementia with behavioral disturbance: Secondary | ICD-10-CM

## 2013-01-17 DIAGNOSIS — F323 Major depressive disorder, single episode, severe with psychotic features: Secondary | ICD-10-CM

## 2013-02-14 ENCOUNTER — Other Ambulatory Visit: Payer: Self-pay | Admitting: Geriatric Medicine

## 2013-02-14 MED ORDER — ALPRAZOLAM 0.25 MG PO TABS: 0.2500 mg | ORAL_TABLET | Freq: Every evening | ORAL | Status: DC | PRN

## 2013-02-16 ENCOUNTER — Non-Acute Institutional Stay (SKILLED_NURSING_FACILITY): Payer: PRIVATE HEALTH INSURANCE | Admitting: Internal Medicine

## 2013-02-16 DIAGNOSIS — E860 Dehydration: Secondary | ICD-10-CM

## 2013-02-16 DIAGNOSIS — L89609 Pressure ulcer of unspecified heel, unspecified stage: Secondary | ICD-10-CM

## 2013-02-16 DIAGNOSIS — J189 Pneumonia, unspecified organism: Secondary | ICD-10-CM

## 2013-02-16 NOTE — Progress Notes (Signed)
Patient ID: [name], female   DOB: 02-Jan-1931, 79 y.o.   MRN: [id number] [facility] Chief complaint; wound on the medial aspect of her right heel, coccyx wound, fever. History; this is a lady who is 79 years old. She 0 over the last several months largely alleviated by severe depression, poor oral intake. She was noted last week to have a small wound on the medial on the right. She now has a fairly substantial stage II pressure wound on the medial aspect of the right calcaneus. She was noted to have a temperature of over 101 last night. He was seen by a nurse practitioner started on IV fluids and given Rocephin. Lab work and urine cultures are pending. She has an IV of normal saline at 100 cc per hour. Staff report that she is not eating, even when she is being fed. She was noted to be lethargic yesterday. Some of her psychiatric medications were put on hold. This morning [Seroquel]  Review of systems; really not possible at this point.   Physical exam; O2 sat is 80% on room air. Pulse 81, respirations 20. Gen. the patient is awake, surprisingly, seemed to know my name. Otherwise, she is restless and moaning. Respiratory crackles at the base of the right lung. However, she does not appear to be in respiratory distress. Cardiac clearly some degree of volume contraction. No evidence of congestive heart failure. No gallops. Abdomen; no liver no spleen no masses and no overt tenderness. GU suprapubic dullness to percussion, although I don't think this is her bladder. There is no costovertebral angle tenderness. Skin medial aspect of the right heel has a stage II wound is roughly 1-1/2 cm in diameter. There is a dusky central area, but no clear evidence of infection. Peripheral pulses are palpable. As mentioned, his wound appears to have been present for the last 3-4 days. Over her coccyx is a very superficial area however, this is certainly at risk of further  breakdown.  Impression/plan #1 fever; she apparently has a positive urinalysis and is on Rocephin for a presumed UTI. However, she has crackles in the right lower lobe, and I wonder about an aspiration pneumonitis. Chest x-ray will be ordered. I will broaden her antibiotic coverage. #2 declining oral intake. The patient is clearly depressed, may have some degree of coexistent delirium. Lab work is pending. #3 overall declining status/failure to thrive. If there is a medical issue in addition to her psychiatric 1. I am not sure what it is. She is not eating sufficiently. Will review CBC and CMP. When they return.  So, overall, to make the right heel wound for now. If this stabilizes collagenase dressing. Next. I think the hydrocolloid-based dressing over her coccyx is a route to go currently with careful followup of the area

## 2013-03-02 ENCOUNTER — Non-Acute Institutional Stay (SKILLED_NURSING_FACILITY): Payer: PRIVATE HEALTH INSURANCE | Admitting: Internal Medicine

## 2013-03-02 DIAGNOSIS — R627 Adult failure to thrive: Secondary | ICD-10-CM

## 2013-03-02 DIAGNOSIS — L89609 Pressure ulcer of unspecified heel, unspecified stage: Secondary | ICD-10-CM

## 2013-03-02 DIAGNOSIS — J189 Pneumonia, unspecified organism: Secondary | ICD-10-CM

## 2013-03-02 NOTE — Progress Notes (Signed)
Patient ID: [name], female   DOB: 1930-09-08, 79 y.o.   MRN: [id number] Facility; [facility]. Chief complaint; review of general medical issues, left heel, and coccyx wound,   History; this is a lady who is 79 years old. Over the last several months largely alleviated by severe depression, poor oral intake. She was noted last week to have a small wound on the medial on the right. She now has a fairly substantial stage II pressure wound on the medial aspect of the right calcaneus. I saw her on 7/9 and ordered an alginate dressings to these wounds. In the, meantime, she'll continue to eat and drink orally, although her wounds have improved.  Review of systems; really not possible at this point. She has lost some weight over the course. This year. Over the last several months. This appears to be stable. She has been treated with antibiotics for a UTI and pneumonia. Chest x-ray showed a left basilar infiltrate either atelectasis or pneumonia. Urine culture was no growth.  Problem list. #1 recent treatment for UTI and pneumonia. 2 left heel and coccyx [see discussion below}. 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 #4 history of SLE and or connective tissue disorder. This. This has never been completely clear. #5 possible new failure to thrive type presentation, her oral intake and physical functional decline  Social she is no code. I note she is not to receive further IV fluid  Physical exam; O2 sat is92 on room air. Pulse 80, respirations 20. Gen. the patient is awake, surprisingly, seemed to know my name. Otherwise, she is restless and moaning. Respiratory crackles at the base of the right lung. However, she does not appear to be in respiratory distress. Cardiac clearly some degree of volume contraction. No evidence of congestive heart failure. No gallops. Abdomen; no liver no spleen no masses and no overt tenderness. GU suprapubic dullness to  percussion, although I don't think this is her bladder. There is no costovertebral angle tenderness. Skin; coccyx is considerably improved, left heel, also improved with dry surface eschar  Impression/plan  #1 left heel wound will change her to call hydrogel #2 recently treated for a febrile illness, probably pneumonia. She has managed to make a recovery. #3 declining status. I think is mostly related to major depression.

## 2013-04-10 ENCOUNTER — Non-Acute Institutional Stay (SKILLED_NURSING_FACILITY): Payer: PRIVATE HEALTH INSURANCE | Admitting: Internal Medicine

## 2013-04-10 DIAGNOSIS — L89609 Pressure ulcer of unspecified heel, unspecified stage: Secondary | ICD-10-CM

## 2013-04-10 DIAGNOSIS — R627 Adult failure to thrive: Secondary | ICD-10-CM

## 2013-04-10 NOTE — Progress Notes (Signed)
Patient ID: [name], female   DOB: March 10, 1931, 79 y.o.   MRN: [id number]  Facility; [facility]. Chief complaint; review of general medical issues, left heel, and coccyx wound,   History; this is a lady who is 79 years old. Over the last several months largely alleviated by severe depression, poor oral intake. She was noted last week to have a small wound on the medial on the right. She now has a fairly substantial stage II pressure wound on the medial aspect of the right calcaneus. I saw her on 7/9 and ordered an alginate dressings to these wounds. In the, meantime, she'll continue to eat and drink orally, although her wounds have improved. She follows with Dr. Welton Flakes for pain management  Review of systems; really not possible at this point. She has lost some weight over the course of the last several months. This appears to be stable. She has been treated with antibiotics for a UTI and pneumonia. Chest x-ray showed a left basilar infiltrate either atelectasis or pneumonia. Urine culture was no growth.  Problem list #1 recent treatment for UTI and pneumonia. 2 left heel and coccyx [see discussion below}. 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 #4 history of SLE and or connective tissue disorder. This. This has never been completely clear. #5 possible new failure to thrive type presentation, her oral intake and physical functional decline  Social she is no code. I note she is not to receive further IV fluid  Physical exam; O2 sat is94 on room air. Pulse 80, respirations 20. Weight is 142 pounds Gen. the patient is awake, surprisingly, seemed to know my name. She is calmer and more conversational than when I saw her a month ago Respiratory crackles at the base of the right lung. However, she does not appear to be in respiratory distress. Cardiac heart sounds are normal. JVP is borderline elevated at 45. Nevertheless no evidence of CHF Abdomen; no  liver no spleen no masses and no overt tenderness. GU suprapubic dullness to percussion, although I don't think this is her bladder. There is no costovertebral angle tenderness. Skin; I did not review her wounds today I will do this on next visit within the next week to 2  Impression/plan  #1 left heel wound will change her to collagen hydrogel #2 recently treated for a febrile illness, probably pneumonia. She has managed to make a recovery. #3 declining status. I think is mostly related to major depression. This seems somewhat better today

## 2013-05-09 ENCOUNTER — Non-Acute Institutional Stay (SKILLED_NURSING_FACILITY): Payer: PRIVATE HEALTH INSURANCE | Admitting: Internal Medicine

## 2013-05-09 DIAGNOSIS — L89609 Pressure ulcer of unspecified heel, unspecified stage: Secondary | ICD-10-CM

## 2013-05-09 DIAGNOSIS — R627 Adult failure to thrive: Secondary | ICD-10-CM

## 2013-05-09 NOTE — Progress Notes (Signed)
  Patient ID: [name], female   DOB: 07-05-1931, 79 y.o.   MRN: [id number]  Facility; [facility]. Chief complaint; review of general medical issues, left heel, and coccyx wound,   History; this is a lady who is 79 years old. Over the last several months largely alleviated by severe depression, poor oral intake. She was noted last week to have a small wound on the medial on the right. She now has a fairly substantial stage II pressure wound on the medial aspect of the right calcaneus. I saw her on 7/9 and ordered an alginate dressings to these wounds. In the, meantime, she'll continue to eat and drink orally, although her wounds have improved. She follows with Dr. Welton Flakes for pain management and psychiatry.  Review of systems; really not possible at this point. She has lost some weight over the course of the last several months. This appears to be stable. She has been treated with antibiotics for a UTI and pneumonia. Chest x-ray showed a left basilar infiltrate either atelectasis or pneumonia. Urine culture showed enterococcus  Problem list #1 recent treatment for UTI and pneumonia. 2 left heel and coccyx [see discussion below}. 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 #4 history of SLE and or connective tissue disorder. This. This has never been completely clear. #5 possible new failure to thrive type presentation, her oral intake and physical functional decline  Social she is no code. I note she is not to receive further IV fluid  Physical exam; O2 sat is94 on room air. Pulse 78 , respirations 20. Weight is 143 pounds with no major change Gen. the patient is awake, surprisingly, seemed to know my name. She is calmer and more conversational than when I saw her a month ago Respiratory crackles at the base of the right lung. However, she does not appear to be in respiratory distress. Cardiac heart sounds are normal. JVP is borderline elevated at 45.  Nevertheless no evidence of CHF Abdomen; no liver no spleen no masses and no overt tenderness. GU suprapubic dullness to percussion, although I don't think this is her bladder. There is no costovertebral angle tenderness. Skin; I did not review her wounds today I will do this on next visit within the next week  Impression/plan  #1 left heel i need to review this #2 declining status. I think is mostly related to major depression. This seems to be still a major issue

## 2013-06-05 ENCOUNTER — Non-Acute Institutional Stay (SKILLED_NURSING_FACILITY): Payer: PRIVATE HEALTH INSURANCE | Admitting: Internal Medicine

## 2013-06-05 DIAGNOSIS — R627 Adult failure to thrive: Secondary | ICD-10-CM

## 2013-06-05 DIAGNOSIS — L89609 Pressure ulcer of unspecified heel, unspecified stage: Secondary | ICD-10-CM

## 2013-06-05 NOTE — Progress Notes (Signed)
Patient ID: [name], female   DOB: 1931/02/16, 79 y.o.   MRN: [id number]   Facility; [facility]. Chief complaint; review of general medical and Psychiatric issues, left heel, and coccyx wound,   History; this is a lady who is 79 years old. Over the last several months largely alleviated by severe depression, poor oral intake. Her heel wounds are resolved. She follows with Dr. Welton Flakes for pain management, and consultant  psychiatry.    Problem list #1 recent treatment for UTI and pneumonia. 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 #4 history of SLE and or connective tissue disorder. This. This has never been completely clear. #5 possible new failure to thrive type presentation, her oral intake and physical functional decline  Social she is no code. I note she is not to receive further IV fluid. She is no code.  Physical exam; O2 sat is94 on room air. Pulse 78 , respirations 20. Weight is 143 pounds with no major change Gen. the patient is awake, surprisingly, seemed to know my name. She is calmer and more conversational than when I saw her a month ago. Considerable improvement in her overall affect is notes Respiratory crackles at the base of the right lung. However, she does not appear to be in respiratory distress. Cardiac heart sounds are normal. JVP is borderline elevated at 45. Nevertheless no evidence of CHF Abdomen; no liver no spleen no masses and no overt tenderness. GU suprapubic dullness to percussion, although I don't think this is her bladder. There is no costovertebral angle tenderness. Skin; Wound on her left heels is resolved. Has Duoderm on the rt. Heel however I see no major open area Mental status; Considerably better.   Impression/plan  #1 left heel; resolved #2 Failure to thrive secondary to sever Major depression now much improved. On seroquel and celexa.

## 2013-06-29 ENCOUNTER — Ambulatory Visit (INDEPENDENT_AMBULATORY_CARE_PROVIDER_SITE_OTHER): Payer: Medicare Other | Admitting: Ophthalmology

## 2013-07-04 ENCOUNTER — Non-Acute Institutional Stay (SKILLED_NURSING_FACILITY): Payer: PRIVATE HEALTH INSURANCE | Admitting: Internal Medicine

## 2013-07-04 DIAGNOSIS — R627 Adult failure to thrive: Secondary | ICD-10-CM

## 2013-07-04 DIAGNOSIS — L89609 Pressure ulcer of unspecified heel, unspecified stage: Secondary | ICD-10-CM

## 2013-07-04 NOTE — Progress Notes (Signed)
Patient ID: [name], female   DOB: Apr 20, 1931, 79 y.o.   MRN: [id number]   Facility; [facility]. Chief complaint; review of general medical and Psychiatric issues, left heel   History; this is a lady who is 79 years old. Over the last several months largely alleviated by severe depression, poor oral intake. Her left heel wounds are resolved. Still with rt. Heel wound She follows with Dr. Welton Flakes for pain management, and consultant  psychiatry.    Problem list #1 recent treatment for UTI and pneumonia. 2 left heel wound #3 dementia with severe coexistent depression. Also with coexistent psychosis. #4 history of SLE and or connective tissue disorder. This. This has never been completely clear. #5 possible new failure to thrive type presentation, her oral intake and physical functional decline  Social she is no code. I note she is not to receive further IV fluid. She is no code.  Physical exam; O2 sat is94 on room air. Pulse 78 , respirations 20. Weight is 143 pounds with no major change Gen. the patient is awake, surprisingly, seemed to know my name. She is calmer and more conversational than when I saw her a month ago. Considerable improvement in her overall affect is notes Respiratory crackles at the base of the right lung. However, she does not appear to be in respiratory distress. Cardiac heart sounds are normal. JVP is borderline elevated at 45. Nevertheless no evidence of CHF Abdomen; no liver no spleen no masses and no overt tenderness. GU suprapubic dullness to percussion, although I don't think this is her bladder. There is no costovertebral angle tenderness. Skin; Wound on her left heels is resolved. Has Duoderm on the rt. Heel however I see no major open area Mental status; Considerably better.   Impression/plan  #1 left heel; resolved. Continued rt heel wound follow-up #2 Failure to thrive secondary to sever Major depression now much improved. On seroquel and celexa.

## 2013-07-31 ENCOUNTER — Non-Acute Institutional Stay (SKILLED_NURSING_FACILITY): Payer: PRIVATE HEALTH INSURANCE | Admitting: Internal Medicine

## 2013-07-31 DIAGNOSIS — F0280 Dementia in other diseases classified elsewhere without behavioral disturbance: Secondary | ICD-10-CM

## 2013-07-31 DIAGNOSIS — F329 Major depressive disorder, single episode, unspecified: Secondary | ICD-10-CM

## 2013-07-31 DIAGNOSIS — N10 Acute tubulo-interstitial nephritis: Secondary | ICD-10-CM

## 2013-07-31 NOTE — Progress Notes (Signed)
Patient ID: [name], female   DOB: 1931-07-15, 79 y.o.   MRN: [id number]    Facility; [facility]. Chief complaint; review of general medical and Psychiatric issues, left heel   History; this is a lady who is 79 years old. Over the last several months largely alleviated by severe depression, poor oral intake. Her left heel wounds are resolved. Still with rt. Heel wound She follows with Dr. Welton Flakes for pain management, and consultant  Psychiatry.  She was treated for a UTI this month that showed greater than 100,000 Providencia rettgeri and she presented with increasing confusion and decreased mobility. This organism was resistant to quinolones and aminoglycosides. Was sensitive to amikacin and advanced generation cephalosporins    Problem list #1 recent treatment for UTI and pneumonia. 2 left heel wound #3 dementia with severe coexistent depression. Also with coexistent psychosis. #4 history of SLE and or connective tissue disorder. This. This has never been completely clear. #5 possible new failure to thrive type presentation, her oral intake and physical functional decline  Social she is no code. I note she is not to receive further IV fluid. She is no code.  Physical exam; temperature 98.6, pulse 84, respirations 20, blood pressure 140/82 weight is 143 pound Gen. the patient is awake, surprisingly, seemed to know my name. She is calmer and more conversational than when I saw her a month ago. Considerable improvement in her overall affect is notes Respiratory crackles at the base of the right lung. However, she does not appear to be in respiratory distress. Cardiac heart sounds are normal. JVP is borderline elevated at 45. Nevertheless no evidence of CHF Abdomen; no liver no spleen no masses and no overt tenderness. GU suprapubic dullness to percussion, although I don't think this is her bladder. There is no costovertebral angle tenderness. Skin; Wound on her left heels is  resolved. Has Duoderm on the rt. Heel however I see no major open area Mental status; Considerably better.   Impression/plan  #1 type 2 diabetes with hemoglobin A1c at 5.8% in October not on any medication #2 Failure to thrive secondary to sever Major depression now much improved. On seroquel and celexa.  #3 Providencia rettgeri UTI/pyelonephritis successfully treated with Rocephin and Omnicef

## 2013-08-29 ENCOUNTER — Non-Acute Institutional Stay (SKILLED_NURSING_FACILITY): Payer: PRIVATE HEALTH INSURANCE | Admitting: Internal Medicine

## 2013-08-29 ENCOUNTER — Other Ambulatory Visit: Payer: Self-pay | Admitting: *Deleted

## 2013-08-29 DIAGNOSIS — F0393 Unspecified dementia, unspecified severity, with mood disturbance: Secondary | ICD-10-CM

## 2013-08-29 DIAGNOSIS — R627 Adult failure to thrive: Secondary | ICD-10-CM

## 2013-08-29 DIAGNOSIS — F3289 Other specified depressive episodes: Secondary | ICD-10-CM

## 2013-08-29 DIAGNOSIS — F0280 Dementia in other diseases classified elsewhere without behavioral disturbance: Secondary | ICD-10-CM

## 2013-08-29 DIAGNOSIS — F329 Major depressive disorder, single episode, unspecified: Secondary | ICD-10-CM

## 2013-08-29 MED ORDER — ALPRAZOLAM 0.25 MG PO TABS: ORAL_TABLET | ORAL | Status: DC

## 2013-08-29 NOTE — Progress Notes (Signed)
Patient ID: [name], female   DOB: 01/23/1931, 80 y.o.   MRN:     Facility; [name]. Chief complaint; review of general medical and Psychiatric issues, optimum visit for December   History; this is a lady who is 80 years old. Over the last several months largely alleviated by severe depression, poor oral intake. Her left heel wounds are close to resolved. Still with rt. Heel wound She follows with Dr. Welton FlakesKhan for pain management, and consultant  Psychiatry    Problem list #1 recent treatment for UTI and pneumonia. 2 left heel wound #3 dementia with severe coexistent depression. Also with coexistent psychosis. #4 history of SLE and or connective tissue disorder. This. This has never been completely clear. #5 possible new failure to thrive type presentation, her oral intake and physical functional decline  Social she is no code. I note she is not to receive further IV fluid. She is no code.  Physical exam; temperature 98.6, pulse 80, respirations 20, blood pressure 130/82 weight is 143 pound Gen. the patient is awake, surprisingly, seemed to know my name. She is calmer and more conversational than when I saw her a month ago. Continued considerable improvement in her affect Respiratory crackles at the base of the right lung. However, she does not appear to be in respiratory distress. Cardiac heart sounds are normal. . Nevertheless no evidence of CHF Abdomen; no liver no spleen no masses and no overt tenderness. GU suprapubic dullness to percussion, although I don't think this is her bladder. There is no costovertebral angle tenderness. Skin; Wound on her left heels is resolved. Has Duoderm on the rt. Heel however I see no major open area Mental status; Considerably better.   Impression/plan  #1 type 2 diabetes with hemoglobin A1c at 5.8% in October not on any medication #2 Failure to thrive secondary to sever Major depression now much improved. On seroquel and celexa.

## 2013-10-06 ENCOUNTER — Non-Acute Institutional Stay (SKILLED_NURSING_FACILITY): Payer: PRIVATE HEALTH INSURANCE | Admitting: Internal Medicine

## 2013-10-06 DIAGNOSIS — F0393 Unspecified dementia, unspecified severity, with mood disturbance: Secondary | ICD-10-CM

## 2013-10-06 DIAGNOSIS — F0280 Dementia in other diseases classified elsewhere without behavioral disturbance: Secondary | ICD-10-CM

## 2013-10-06 DIAGNOSIS — F3289 Other specified depressive episodes: Secondary | ICD-10-CM

## 2013-10-06 DIAGNOSIS — F329 Major depressive disorder, single episode, unspecified: Secondary | ICD-10-CM

## 2013-10-06 DIAGNOSIS — J189 Pneumonia, unspecified organism: Secondary | ICD-10-CM

## 2013-10-06 NOTE — Progress Notes (Signed)
Patient ID: Jenna JanusMoncie G Haught, female   DOB: 02/10/1931, 80 y.o.   MRN: [id number]    Facility; [facility]. Chief complaint; review of general medical and Psychiatric issues, optimum visit for January   History; this is a lady who is 80 years old. Over the last several months largely alleviated by severe depression, poor oral intake. Her left heel wounds are close to resolved. Still with rt. Heel wound She follows with Dr. Welton FlakesKhan for pain management, and consultant  Psychiatry Her depression is now considerably better and is stabilized the.  She was treated on 2/12 with Augmentin for a suspected pneumonia. Chest x-ray showed increased interstitial changes present bilaterally  Problem list #1 recent treatment for UTI and pneumonia. 2 left heel wound #3 dementia with severe coexistent depression. Also with coexistent psychosis. #4 history of SLE and or connective tissue disorder. This. This has never been completely clear. #5 possible new failure to thrive type presentation, her oral intake and physical functional decline  Social she is no code. I note she is not to receive further IV fluid.   Physical exam; temperature 97.9 pulse 74 respirations 22 weight is 156 pounds blood pressure 142/70 Gen. the patient is awake, surprisingly, seemed to know my name. She is calmer and more conversational than when I saw her a month ago. Continued considerable improvement in her affect Respiratory crackles at the base of the right lung. However, she does not appear to be in respiratory distress. Cardiac heart sounds are normal. . Nevertheless no evidence of CHF Abdomen; no liver no spleen no masses and no overt tenderness. GU suprapubic dullness to percussion, although I don't think this is her bladder. There is no costovertebral angle tenderness. Skin; Wound on her left heels is resolved. Has Duoderm on the rt. Heel however I see no major open area Mental status; Considerably better.    Impression/plan  #1 type 2 diabetes with hemoglobin A1c at 5.8% in October not on any medication #2 Failure to thrive secondary to sever Major depression now much improved. On seroquel and celexa.  #3 treated for a pneumonia with Augmentin she seems stable at the moment

## 2013-11-06 ENCOUNTER — Non-Acute Institutional Stay (SKILLED_NURSING_FACILITY): Payer: PRIVATE HEALTH INSURANCE | Admitting: Internal Medicine

## 2013-11-06 DIAGNOSIS — R627 Adult failure to thrive: Secondary | ICD-10-CM

## 2013-11-06 DIAGNOSIS — J189 Pneumonia, unspecified organism: Secondary | ICD-10-CM

## 2013-11-06 NOTE — Progress Notes (Signed)
Patient ID: [name], female   DOB: 07/05/1931, 80 y.o.   MRN: [id number]    Facility; [facility]. Chief complaint; review of general medical and Psychiatric issues, optimum visit for February   History; this is a lady who is 80 years old. Over the last several months largely alleviated by severe depression, poor oral intake. Her left heel wounds are close to resolved. Still with rt. Heel wound She follows with Dr. Welton FlakesKhan for pain management, and consultant  Psychiatry Her depression is now considerably better and is stabilized the.  She was treated on 2/12 with Augmentin for a suspected pneumonia. Chest x-ray showed increased interstitial changes present bilaterally. Diet has been downgraded.   Problem list #1 recent treatment for UTI and pneumonia. 2 left heel wound #3 dementia with severe coexistent depression. Also with coexistent psychosis. #4 history of SLE and or connective tissue disorder. This. This has never been completely clear. #5 possible new failure to thrive type presentation, her oral intake and physical functional decline. This was largely secondary secondary to depression and seems more stable  Social she is no code. I note she is not to receive further IV fluid.   Physical exam; respirations 22 weight is 151 pounds blood pressure 112/72 Gen. Much better than a few months ago.  Respiratory crackles at the base of the right lung. However, she does not appear to be in respiratory distress. Cardiac heart sounds are normal. . Nevertheless no evidence of CHF Abdomen; no liver no spleen no masses and no overt tenderness. GU suprapubic dullness to percussion, although I don't think this is her bladder. There is no costovertebral angle tenderness. Skin; Wound on her left heels is resolved. Has Duoderm on the rt. Heel however I see no major open area Mental status; Considerably better.   Impression/plan  #1 type 2 diabetes with hemoglobin A1c at 5.8% in October not  on any medication #2 Failure to thrive secondary to sever Major depression now much improved. On seroquel and celexa.  #3 treated for a pneumonia with Augmentin she seems stable at the moment

## 2013-12-03 ENCOUNTER — Non-Acute Institutional Stay (SKILLED_NURSING_FACILITY): Payer: PRIVATE HEALTH INSURANCE | Admitting: Internal Medicine

## 2013-12-03 DIAGNOSIS — F0280 Dementia in other diseases classified elsewhere without behavioral disturbance: Secondary | ICD-10-CM

## 2013-12-03 DIAGNOSIS — R627 Adult failure to thrive: Secondary | ICD-10-CM

## 2013-12-03 DIAGNOSIS — M1610 Unilateral primary osteoarthritis, unspecified hip: Secondary | ICD-10-CM

## 2013-12-03 DIAGNOSIS — M169 Osteoarthritis of hip, unspecified: Secondary | ICD-10-CM

## 2013-12-03 DIAGNOSIS — M1612 Unilateral primary osteoarthritis, left hip: Secondary | ICD-10-CM | POA: Insufficient documentation

## 2013-12-03 DIAGNOSIS — G309 Alzheimer's disease, unspecified: Secondary | ICD-10-CM

## 2013-12-03 DIAGNOSIS — F329 Major depressive disorder, single episode, unspecified: Secondary | ICD-10-CM

## 2013-12-03 DIAGNOSIS — J189 Pneumonia, unspecified organism: Secondary | ICD-10-CM | POA: Insufficient documentation

## 2013-12-03 DIAGNOSIS — E1149 Type 2 diabetes mellitus with other diabetic neurological complication: Secondary | ICD-10-CM

## 2013-12-03 NOTE — Progress Notes (Signed)
Patient ID: [name], female   DOB: 08/23/1930, 80 y.o.   MRN: [id number]    Facility; [facility]. Chief complaint; review of general medical and Psychiatric issues, optimum visit for march   History; this is a lady who is 80 years old. Over the last several months largely alleviated by severe depression, poor oral intake. Her left heel wounds are close to resolved. Still with rt. Heel wound She follows with Dr. Welton FlakesKhan for pain management, and consultant  Psychiatry Her depression is now considerably better and is stabilized the.  She was treated on 3/31 for a chest x-ray that showed slight bilateral lower pneumonia with Rocephin. She follows with Dr. Welton FlakesKhan for pain issues related to chronic osteoarthritis, peripheral neuropathy  Problem list #1 recent treatment for UTI and pneumonia. 2 left heel wound #3 dementia with severe coexistent depression. Also with coexistent psychosis. #4 history of SLE and or connective tissue disorder. This. This has never been completely clear. #5 possible new failure to thrive type presentation, her oral intake and physical functional decline. This was largely secondary secondary to depression and seems more stable  Social she is no code. I note she is not to receive further IV fluid.   Physical exam; temperature 99.2-pulse 68-respirations 20-blood pressure 114/68 weight 150 pounds. O2 sat 94% on 2 L Gen. Much better than a few months ago.  Respiratory crackles at the base of the right lung with decreased air entry. However, she does not appear to be in respiratory distress. Cardiac heart sounds are normal. . Nevertheless no evidence of CHF Abdomen; no liver no spleen no masses and no overt tenderness. GU suprapubic dullness to percussion, although I don't think this is her bladder. There is no costovertebral angle tenderness. Skin; Wound on her left heels is resolved. Has Duoderm on the rt. Heel however I see no major open area Mental status;  Considerably better.   Impression/plan  #1 type 2 diabetes with hemoglobin A1c at 5.8% in October not on any medication #2 Failure to thrive secondary to sever Major depression now much improved. On seroquel and celexa.  #3 treated for a pneumonia with Rocephin she seems stable at the moment. Findings at the bedside suggest right lower lobe. Would wonder about recurrent aspiration

## 2013-12-21 ENCOUNTER — Non-Acute Institutional Stay (SKILLED_NURSING_FACILITY): Payer: PRIVATE HEALTH INSURANCE | Admitting: Internal Medicine

## 2013-12-21 DIAGNOSIS — R0902 Hypoxemia: Secondary | ICD-10-CM

## 2013-12-21 DIAGNOSIS — I509 Heart failure, unspecified: Secondary | ICD-10-CM

## 2013-12-21 DIAGNOSIS — J690 Pneumonitis due to inhalation of food and vomit: Secondary | ICD-10-CM

## 2013-12-26 NOTE — Progress Notes (Addendum)
Patient ID: [name], female   DOB: 11/09/1930, 80 y.o.   MRN: [id number]                  PROGRESS NOTE  DATE:  12/21/2013    [facility]    LEVEL OF CARE:   SNF   Acute Visit   CHIEF COMPLAINT:  Follow up pneumonia.    HISTORY OF PRESENT ILLNESS:   This is a frail,. 80 year-old woman who was treated late in March for pneumonia in the right lower lobe with Rocephin.  She appeared to recover.    Over the last two days, she again has developed respiratory distress with chest x-ray showing right lower lobe infiltrate with small effusion.  She is requiring high-flow oxygen.    REVIEW OF SYSTEMS:  I do not know that it would be meaningful to go through this.   However, she is not really complaining of shortness of breath.  I am not sure how cognizant she is at the moment.    PHYSICAL EXAMINATION:   VITAL SIGNS:   RESPIRATIONS:  The patient is tachypneic with a respiratory rate of 30.  O2 SATURATIONS:  80% on 8 L.   PULSE:  71.   GENERAL APPEARANCE:    CHEST/RESPIRATORY:  There are marked upper airway sounds with prolonged expiratory phase and expiratory wheezing.   CARDIOVASCULAR:  CARDIAC:  Heart sounds are distant.  However, there is no overt CHF although I note a BNP that was over 3,000.   GASTROINTESTINAL:  LIVER/SPLEEN/KIDNEYS:  No liver, no spleen.  No tenderness.   CIRCULATION:  EDEMA/VARICOSITIES:  Extremities:  No evidence of a DVT.     ASSESSMENT/PLAN:  Aspiration pneumonitis.  Probably recurrent.  This appears to be a severe episode.  She  apparently looks better than 48 hours ago.  I will continue the Rocephin and clindamycin, routine DuoNebs, and I will give her Solu-Medrol followed by a prednisone taper.  We will continue to watch her vital signs.    Congestive heart failure.  If there is congestive heart failure in there, it is difficult to pick up on at the bedside.  She may need a follow-up chest x-ray.

## 2014-01-04 ENCOUNTER — Non-Acute Institutional Stay (SKILLED_NURSING_FACILITY): Payer: PRIVATE HEALTH INSURANCE | Admitting: Internal Medicine

## 2014-01-04 DIAGNOSIS — G309 Alzheimer's disease, unspecified: Secondary | ICD-10-CM

## 2014-01-04 DIAGNOSIS — F0280 Dementia in other diseases classified elsewhere without behavioral disturbance: Secondary | ICD-10-CM

## 2014-01-04 DIAGNOSIS — J690 Pneumonitis due to inhalation of food and vomit: Secondary | ICD-10-CM

## 2014-01-04 NOTE — Progress Notes (Signed)
Patient ID: [name], female   DOB: 1931/03/06, 80 y.o.   MRN: [id number]    Facility; [facility]. Chief complaint; review of general medical and Psychiatric issues, optimum visit for april   History; this is a lady who is 80 years old. Over the last several months largely alleviated by severe depression, poor oral intake. Her left heel wounds are close to resolved. Still with rt. Heel wound She follows with Dr. Welton Flakes for pain management, and consultant  Psychiatry Her depression is now considerably better and is stabilized the.  She was treated on 3/31 for a chest x-ray that showed slight bilateral lower pneumonia with Rocephin.   Earlier this month was seen for a Right lower lobe aspiration pnemonia. She was Rxed with clindamycin and rocephin. This appears to have cleared however she remains frail.  Problem list #1 recent treatment for UTI and pneumonia. 2 left heel wound #3 dementia with severe coexistent depression. Also with coexistent psychosis. #4 history of SLE and or connective tissue disorder. This. This has never been completely clear. #5 possible new failure to thrive type presentation, her oral intake and physical functional decline. This was largely secondary secondary to depression and seems more stable  Social she is no code. I note she is not to receive further IV fluid.   Physical exam; temperature T-98 P-74 RR 20 O2 sat 91% on 3l. Weight is 138lbs.  Gen. Appears very frail, staring forward blankly.  Heent. It had been mentioned that she might now be able to see although she appeared able to identify most of the objects I showed her Respiratory crackles at the base of the right lung with decreased air entry. However, she does not appear to be in respiratory distress. Cardiac heart sounds are normal. . Nevertheless no evidence of CHF Abdomen; no liver no spleen no masses and no overt tenderness. GU suprapubic dullness to percussion, although I don't think this  is her bladder. There is no costovertebral angle tenderness. Skin; Wound on her left heels is resolved. Has Duoderm on the rt. Heel however I see no major open area Mental status; Again depressed looking   Impression/plan 1) Very significant aspiration Pneumonia. More stable now. 2) Dementia with coexistant depression on celexa

## 2014-01-18 ENCOUNTER — Non-Acute Institutional Stay (SKILLED_NURSING_FACILITY): Payer: PRIVATE HEALTH INSURANCE | Admitting: Internal Medicine

## 2014-01-18 DIAGNOSIS — R0902 Hypoxemia: Secondary | ICD-10-CM

## 2014-01-18 DIAGNOSIS — J690 Pneumonitis due to inhalation of food and vomit: Secondary | ICD-10-CM

## 2014-01-23 ENCOUNTER — Other Ambulatory Visit: Payer: Self-pay | Admitting: *Deleted

## 2014-01-23 MED ORDER — ALPRAZOLAM 0.25 MG PO TABS: ORAL_TABLET | ORAL | Status: AC

## 2014-01-23 NOTE — Telephone Encounter (Signed)
[facility] 

## 2014-01-23 NOTE — Progress Notes (Addendum)
Patient ID: [name], female   DOB: 12/18/1930, 80 y.o.   MRN: [id number]                  PROGRESS NOTE  DATE:  01/18/2014    [facility]    LEVEL OF CARE:   SNF   Acute Visit   CHIEF COMPLAINT:  Review of general medical issues/discussion with [facility] nurse practitioner.    HISTORY OF PRESENT ILLNESS:  I follow [name] very frequently.  She had a very extensive aspiration pneumonia bilaterally in early April.  I really did not think she was going to make it through this.  She was treated with Rocephin and clindamycin and, miraculously, she seems to have done better and from some points of view has stabilized.  Her last chest x-ray in the facility on 01/07/2014 only showed cardiomegaly.  There was no evidence of an infiltrate.  Her previous infiltrates had cleared.  Nevertheless, we do not seem to have been able to wean her off oxygen.  Today, on 8 L, her O2 sat is 93%.  She does have COPD.  However, I am not exactly sure what is causing this.    She really has no complaints.  She does not complain of chest pain or shortness of breath.  However, she talks to me very little and almost seems to look through me at some points.     PHYSICAL EXAMINATION:   VITAL SIGNS:   TEMPERATURE:  She is afebrile.   PULSE:  73.    O2 SATURATIONS:  93% on 8 L via nasal cannula.   RESPIRATIONS:  20 and unlabored.   WEIGHT:  133 pounds.  This seems to be going down gradually.   CHEST/RESPIRATORY:  Upper airway sounds, but no crackles and no obvious wheezes.  There is no accessory muscle use.   CARDIOVASCULAR:  CARDIAC:   Heart sounds are normal.  There are no murmurs.  No obvious congestive heart failure.    EDEMA/VARICOSITIES:  Extremities:  No evidence of a DVT.    ASSESSMENT/PLAN:  Miraculously has made it through an extensive bilateral pneumonia.  However, she continues to require very high flow oxygen.  The reason behind this is not completely obvious.  Her most recent chest  x-ray showed improvement.  I am not of the mind to aggressively investigate her, but I think aggressive investigations probably would include a CT scan, blood gas, etc.  According to the [facility] staff, her eldest son is giving mixed messages about aggressiveness of care.  She remains a DNR.

## 2014-02-08 DEATH — deceased

## 2014-03-01 ENCOUNTER — Ambulatory Visit (INDEPENDENT_AMBULATORY_CARE_PROVIDER_SITE_OTHER): Payer: PRIVATE HEALTH INSURANCE | Admitting: Ophthalmology
